# Patient Record
Sex: Female | Born: 1955 | Race: White | Hispanic: No | Marital: Married | Smoking: Never smoker
Health system: Southern US, Community
[De-identification: ages and names within clinical notes are randomized; demographics above are authoritative.]

## PROBLEM LIST (undated history)

## (undated) DIAGNOSIS — R011 Cardiac murmur, unspecified: Secondary | ICD-10-CM

## (undated) DIAGNOSIS — K219 Gastro-esophageal reflux disease without esophagitis: Secondary | ICD-10-CM

## (undated) DIAGNOSIS — R0600 Dyspnea, unspecified: Secondary | ICD-10-CM

## (undated) HISTORY — PX: FRACTURE SURGERY: SHX138

## (undated) HISTORY — PX: CHOLECYSTECTOMY: SHX55

## (undated) HISTORY — PX: OTHER SURGICAL HISTORY: SHX169

## (undated) HISTORY — PX: TUBAL LIGATION: SHX77

## (undated) HISTORY — PX: LAPAROSCOPIC GASTRIC SLEEVE RESECTION: SHX5895

---

## 2011-11-20 ENCOUNTER — Ambulatory Visit: Payer: Self-pay | Admitting: Internal Medicine

## 2011-11-20 ENCOUNTER — Inpatient Hospital Stay: Payer: Self-pay | Admitting: Surgery

## 2011-11-20 LAB — CBC
HCT: 45.9 %
HGB: 15.8 g/dL
MCH: 31.2 pg
MCHC: 34.4 g/dL
MCV: 91 fL
Platelet: 212 x10 3/mm 3
RBC: 5.06 X10 6/mm 3
RDW: 13 %
WBC: 10.2 x10 3/mm 3

## 2011-11-20 LAB — COMPREHENSIVE METABOLIC PANEL
Alkaline Phosphatase: 123 U/L (ref 50–136)
BUN: 21 mg/dL — ABNORMAL HIGH (ref 7–18)
Calcium, Total: 9.6 mg/dL (ref 8.5–10.1)
Co2: 27 mmol/L (ref 21–32)
Creatinine: 0.87 mg/dL (ref 0.60–1.30)
EGFR (Non-African Amer.): >60
Glucose: 112 mg/dL — ABNORMAL HIGH (ref 65–99)
SGOT(AST): 24 U/L (ref 15–37)
SGPT (ALT): 20 U/L

## 2011-11-20 LAB — TROPONIN I: Troponin-I: <0.02 ng/mL

## 2011-11-20 LAB — LIPASE, BLOOD: Lipase: 130 U/L

## 2011-11-21 LAB — BASIC METABOLIC PANEL
BUN: 25 mg/dL — ABNORMAL HIGH (ref 7–18)
BUN: 23 mg/dL — ABNORMAL HIGH (ref 7–18)
Calcium, Total: 9 mg/dL (ref 8.5–10.1)
Calcium, Total: 7.1 mg/dL — ABNORMAL LOW (ref 8.5–10.1)
Co2: 20 mmol/L — ABNORMAL LOW (ref 21–32)
Creatinine: 0.95 mg/dL (ref 0.60–1.30)
Creatinine: 0.88 mg/dL (ref 0.60–1.30)
EGFR (African American): >60
EGFR (African American): >60
EGFR (Non-African Amer.): >60
EGFR (Non-African Amer.): >60
Glucose: 230 mg/dL — ABNORMAL HIGH (ref 65–99)
Glucose: 211 mg/dL — ABNORMAL HIGH (ref 65–99)
Potassium: 4.7 mmol/L (ref 3.5–5.1)
Sodium: 141 mmol/L (ref 136–145)

## 2011-11-21 LAB — CBC WITH DIFFERENTIAL/PLATELET
Basophil #: 0 10*3/uL (ref 0.0–0.1)
Basophil %: 0.1 %
Eosinophil #: 0 10*3/uL (ref 0.0–0.7)
Eosinophil %: 0 %
HCT: 55.9 % — ABNORMAL HIGH (ref 35.0–47.0)
HCT: 42.9 % (ref 35.0–47.0)
HGB: 18.7 g/dL — ABNORMAL HIGH (ref 12.0–16.0)
Lymphocyte #: 0.5 10*3/uL — ABNORMAL LOW (ref 1.0–3.6)
Lymphocyte #: 0.4 10*3/uL — ABNORMAL LOW (ref 1.0–3.6)
MCH: 30.5 pg (ref 26.0–34.0)
MCHC: 32.6 g/dL (ref 32.0–36.0)
MCV: 92 fL (ref 80–100)
MCV: 93 fL (ref 80–100)
Monocyte #: 0.4 x10 3/mm (ref 0.2–0.9)
Monocyte #: 0.4 x10 3/mm (ref 0.2–0.9)
Monocyte %: 3.1 %
Monocyte %: 3.8 %
Neutrophil #: 10.7 10*3/uL — ABNORMAL HIGH (ref 1.4–6.5)
Neutrophil #: 10.7 10*3/uL — ABNORMAL HIGH (ref 1.4–6.5)
Neutrophil %: 92.5 %
RBC: 6.06 10*6/uL — ABNORMAL HIGH (ref 3.80–5.20)
RDW: 12.9 % (ref 11.5–14.5)
WBC: 11.6 10*3/uL — ABNORMAL HIGH (ref 3.6–11.0)
WBC: 11.5 10*3/uL — ABNORMAL HIGH (ref 3.6–11.0)

## 2011-11-21 LAB — URINALYSIS, COMPLETE
Bacteria: NONE SEEN
Bilirubin,UR: NEGATIVE
Blood: NEGATIVE
Glucose,UR: NEGATIVE mg/dL (ref 0–75)
Hyaline Cast: 23
Nitrite: NEGATIVE
Ph: 5 (ref 4.5–8.0)
Specific Gravity: 1.03 (ref 1.003–1.030)
Squamous Epithelial: 3

## 2011-11-22 LAB — CBC WITH DIFFERENTIAL/PLATELET
Basophil #: 0 10*3/uL (ref 0.0–0.1)
Basophil %: 0.1 %
Eosinophil #: 0 10*3/uL (ref 0.0–0.7)
HCT: 35.3 % (ref 35.0–47.0)
Lymphocyte #: 0.9 10*3/uL — ABNORMAL LOW (ref 1.0–3.6)
Lymphocyte %: 8.6 %
MCH: 31.6 pg (ref 26.0–34.0)
MCHC: 34.5 g/dL (ref 32.0–36.0)
MCV: 92 fL (ref 80–100)
Neutrophil %: 83.8 %
Platelet: 157 10*3/uL (ref 150–440)
RDW: 12.9 % (ref 11.5–14.5)

## 2011-11-22 LAB — BASIC METABOLIC PANEL
Anion Gap: 8 (ref 7–16)
Calcium, Total: 7.7 mg/dL — ABNORMAL LOW (ref 8.5–10.1)
Co2: 24 mmol/L (ref 21–32)
EGFR (African American): >60
Glucose: 157 mg/dL — ABNORMAL HIGH (ref 65–99)
Osmolality: 280 (ref 275–301)
Potassium: 5.1 mmol/L (ref 3.5–5.1)
Sodium: 138 mmol/L (ref 136–145)

## 2011-11-24 LAB — CBC WITH DIFFERENTIAL/PLATELET
Basophil #: 0 x10 3/mm 3
Basophil %: 0.3 %
Eosinophil #: 0 x10 3/mm 3
Eosinophil %: 0.6 %
HCT: 25.3 % — ABNORMAL LOW
HGB: 8.7 g/dL — ABNORMAL LOW
Lymphocyte %: 27.6 %
Lymphs Abs: 1.5 x10 3/mm 3
MCH: 31.6 pg
MCHC: 34.6 g/dL
MCV: 92 fL
Monocyte #: 0.4 "x10 3/mm "
Monocyte %: 7.4 %
Neutrophil #: 3.4 x10 3/mm 3
Neutrophil %: 64.1 %
Platelet: 113 x10 3/mm 3 — ABNORMAL LOW
RBC: 2.77 X10 6/mm 3 — ABNORMAL LOW
RDW: 13.1 %
WBC: 5.3 x10 3/mm 3

## 2011-11-24 LAB — BASIC METABOLIC PANEL
Anion Gap: 6 — ABNORMAL LOW (ref 7–16)
Chloride: 107 mmol/L (ref 98–107)
Co2: 32 mmol/L (ref 21–32)
Creatinine: 0.59 mg/dL — ABNORMAL LOW (ref 0.60–1.30)
EGFR (Non-African Amer.): >60
Sodium: 145 mmol/L (ref 136–145)

## 2011-11-25 LAB — CBC WITH DIFFERENTIAL/PLATELET
Basophil #: 0 10*3/uL (ref 0.0–0.1)
Basophil %: 0.6 %
Eosinophil %: 1.4 %
HCT: 30.2 % — ABNORMAL LOW (ref 35.0–47.0)
HGB: 10.6 g/dL — ABNORMAL LOW (ref 12.0–16.0)
Lymphocyte #: 1.4 10*3/uL (ref 1.0–3.6)
MCH: 31.7 pg (ref 26.0–34.0)
MCV: 91 fL (ref 80–100)
Monocyte #: 0.3 x10 3/mm (ref 0.2–0.9)
Monocyte %: 6.9 %
Neutrophil #: 2.8 10*3/uL (ref 1.4–6.5)
Platelet: 175 10*3/uL (ref 150–440)
RBC: 3.34 10*6/uL — ABNORMAL LOW (ref 3.80–5.20)
WBC: 4.7 10*3/uL (ref 3.6–11.0)

## 2011-11-25 LAB — BASIC METABOLIC PANEL
Anion Gap: 7 (ref 7–16)
BUN: 5 mg/dL — ABNORMAL LOW (ref 7–18)
Chloride: 106 mmol/L (ref 98–107)
Creatinine: 0.54 mg/dL — ABNORMAL LOW (ref 0.60–1.30)
EGFR (African American): >60
EGFR (Non-African Amer.): >60
Glucose: 100 mg/dL — ABNORMAL HIGH (ref 65–99)
Osmolality: 282 (ref 275–301)
Potassium: 3.2 mmol/L — ABNORMAL LOW (ref 3.5–5.1)
Sodium: 143 mmol/L (ref 136–145)

## 2012-01-26 ENCOUNTER — Ambulatory Visit: Payer: Self-pay | Admitting: Surgery

## 2012-01-26 LAB — HEPATIC FUNCTION PANEL A (ARMC)
Alkaline Phosphatase: 95 U/L (ref 50–136)
Bilirubin, Direct: 0.2 mg/dL (ref 0.00–0.20)
Bilirubin,Total: 0.8 mg/dL (ref 0.2–1.0)
Total Protein: 7.1 g/dL (ref 6.4–8.2)

## 2012-01-26 LAB — CBC WITH DIFFERENTIAL/PLATELET
Basophil %: 2.7 %
Eosinophil #: 0.1 10*3/uL (ref 0.0–0.7)
Eosinophil %: 1.9 %
HCT: 40.7 % (ref 35.0–47.0)
Lymphocyte #: 1.4 10*3/uL (ref 1.0–3.6)
Monocyte #: 0.2 x10 3/mm (ref 0.2–0.9)
Neutrophil #: 2 10*3/uL (ref 1.4–6.5)
Neutrophil %: 53.1 %
Platelet: 190 10*3/uL (ref 150–440)
RBC: 4.34 10*6/uL (ref 3.80–5.20)
RDW: 14 % (ref 11.5–14.5)
WBC: 3.8 10*3/uL (ref 3.6–11.0)

## 2012-01-26 LAB — BASIC METABOLIC PANEL
BUN: 15 mg/dL (ref 7–18)
Calcium, Total: 9.2 mg/dL (ref 8.5–10.1)
Chloride: 104 mmol/L (ref 98–107)
Co2: 31 mmol/L (ref 21–32)
EGFR (Non-African Amer.): >60
Glucose: 65 mg/dL (ref 65–99)
Osmolality: 284 (ref 275–301)
Potassium: 3.9 mmol/L (ref 3.5–5.1)
Sodium: 143 mmol/L (ref 136–145)

## 2012-01-31 ENCOUNTER — Ambulatory Visit: Payer: Self-pay | Admitting: Surgery

## 2013-09-12 IMAGING — CR DG CHEST 1V PORT
1 series · 1 of 1 positions shown · non-contrast
Comparison: none

REASON FOR EXAM: Line placement
COMMENTS:

[portable]
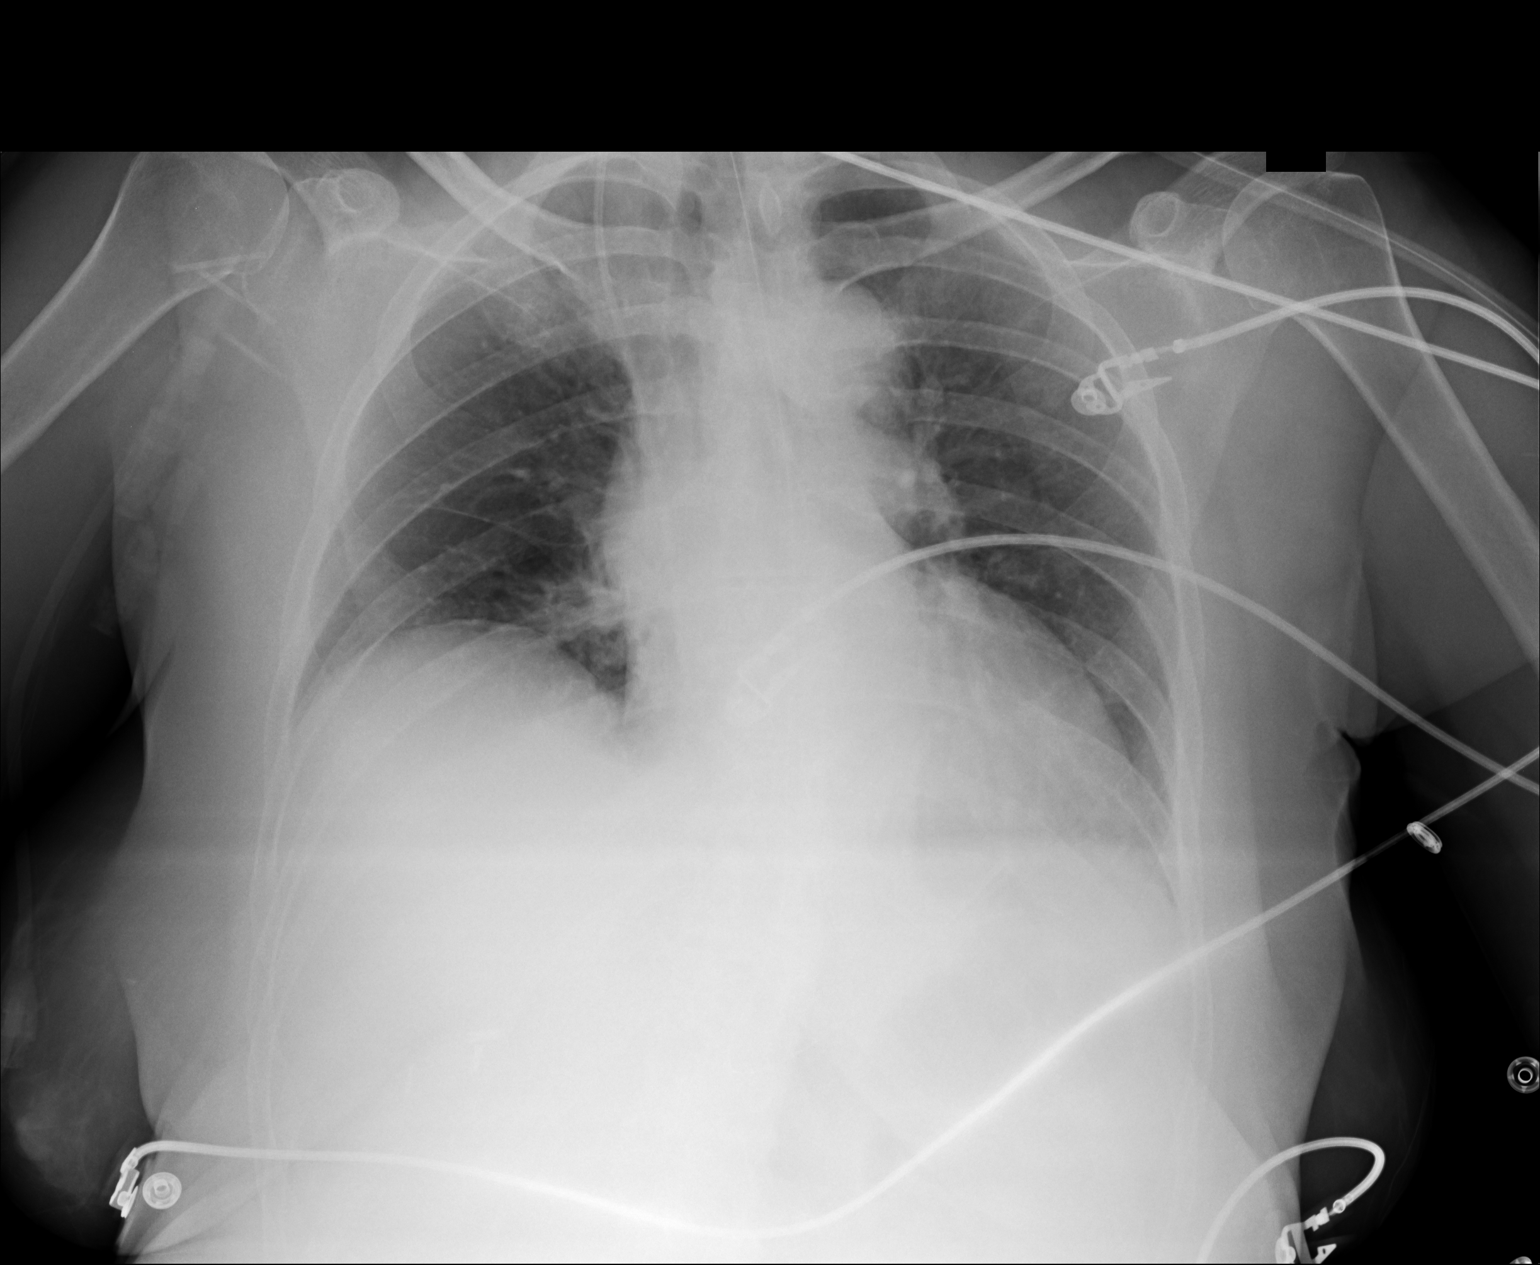

[1 of 1 positions shown; findings below may reference images not displayed]

PROCEDURE:     DXR - DXR PORTABLE CHEST SINGLE VIEW  - November 21, 2011  [DATE]

RESULT:     The patient has undergone interval placement of a right internal
jugular venous catheter. The tip lies in the region of the junction of the
SVC with the right atrium. The lungs are hypoinflated especially on the
right. The cardiac silhouette is top normal in size. There is no evidence of
a pleural effusion or alveolar infiltrate or pulmonary vascular congestion.
There is no pneumothorax.
IMPRESSION: There is no evidence of a post procedure complication
following placement of a right internal jugular venous catheter.

## 2013-09-12 IMAGING — CT CT ABD-PELV W/ CM
1 of 3 series · 14 of 32 positions shown, 18 images · IV contrast (isovue)
Comparison: none

REASON FOR EXAM: (1) Ischemic bowel; (2) same
COMMENTS:

PROCEDURE:     CT  - CT ABDOMEN / PELVIS  W  - November 21, 2011  [DATE]
RESULT:     Comparison:  11/20/2011
TECHNIQUE: Multiple axial images of the abdomen and pelvis were performed
from the lung bases to the pubic symphysis, with p.o. contrast and with 85
mL of Isovue 370 intravenous contrast.

[Series 2: soft tissue · axial · 0.67mm/px · z∈[-308,+134]mm · 14 of 163 slices shown, 18 images]
[im 8/163  soft-tissue]
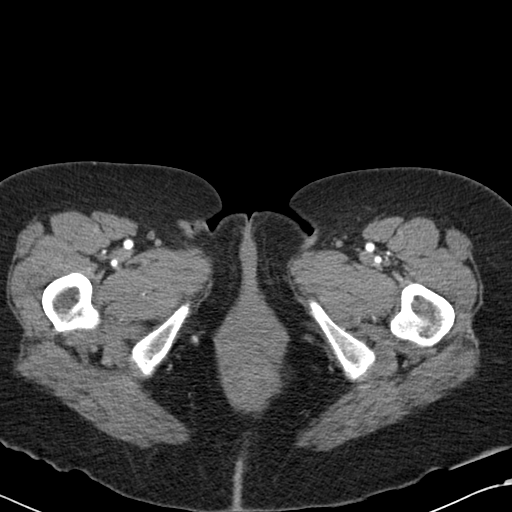
[im 8/163  bone]
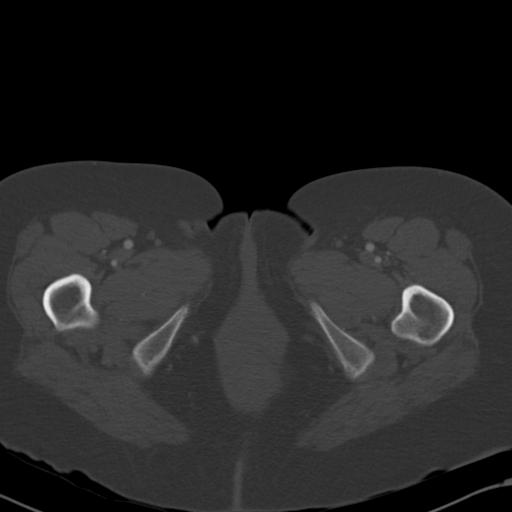
[im 23/163  soft-tissue]
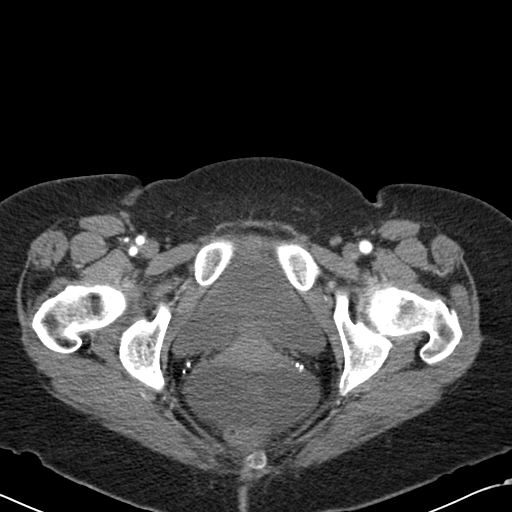
[im 37/163  soft-tissue]
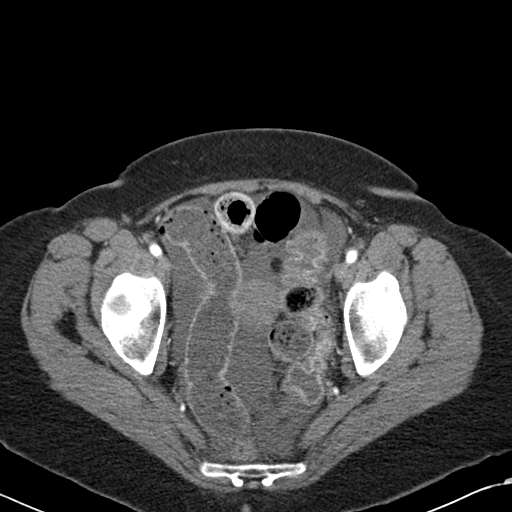
[im 52/163  soft-tissue]
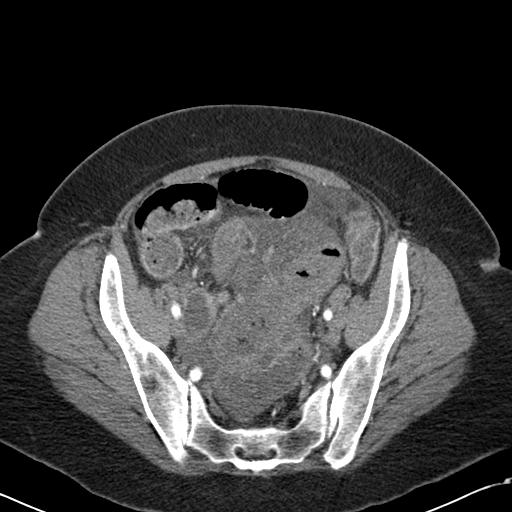
[im 59/163  soft-tissue]
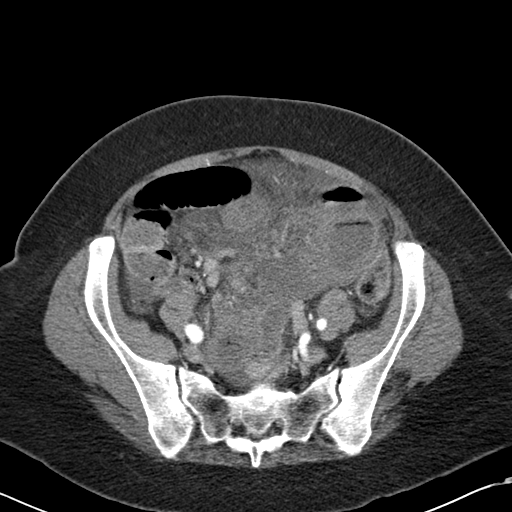
[im 74/163  soft-tissue]
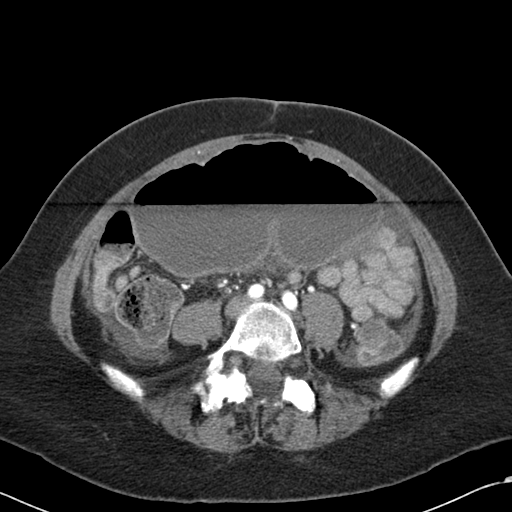
[im 89/163  soft-tissue]
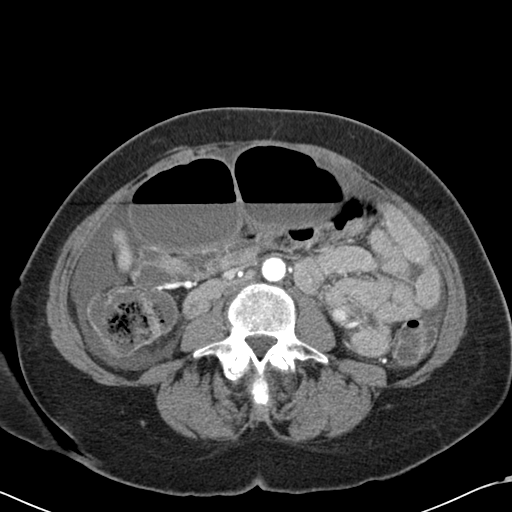
[im 104/163  soft-tissue]
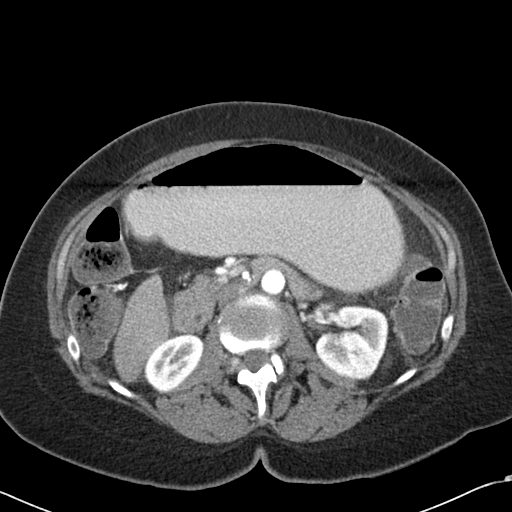
[im 111/163  soft-tissue]
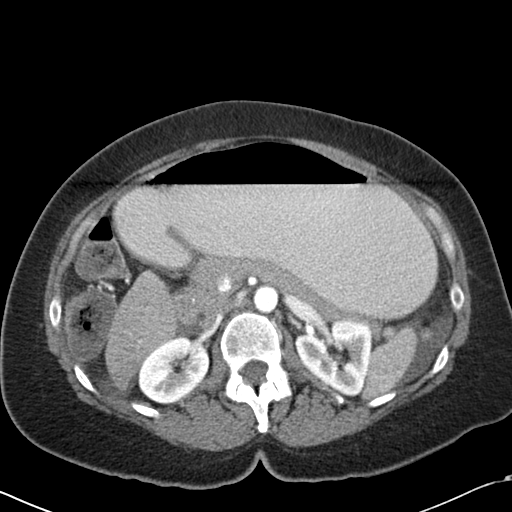
[im 111/163  bone]
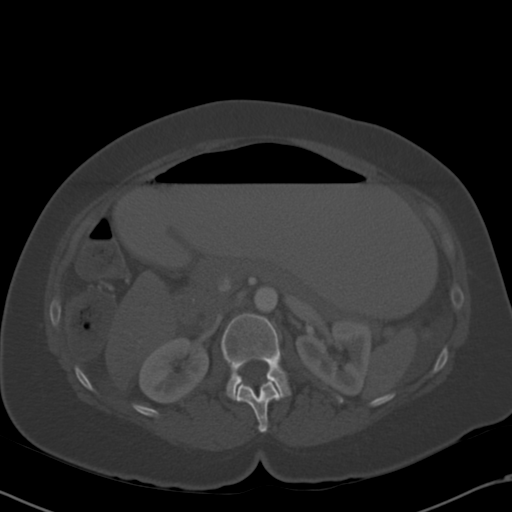
[im 126/163  soft-tissue]
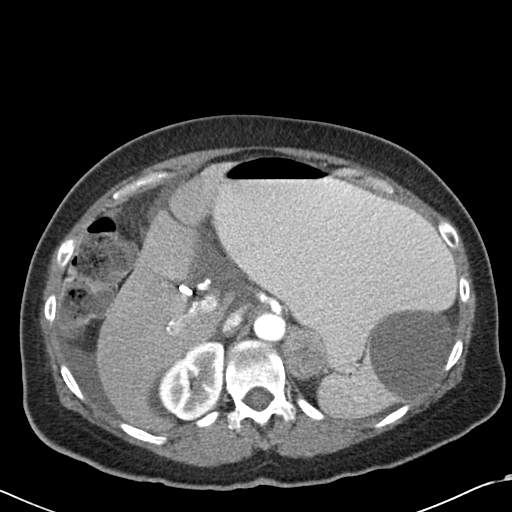
[im 133/163  lung]
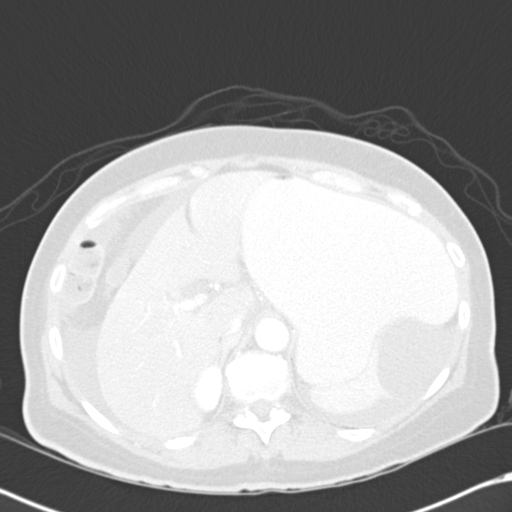
[im 140/163  soft-tissue]
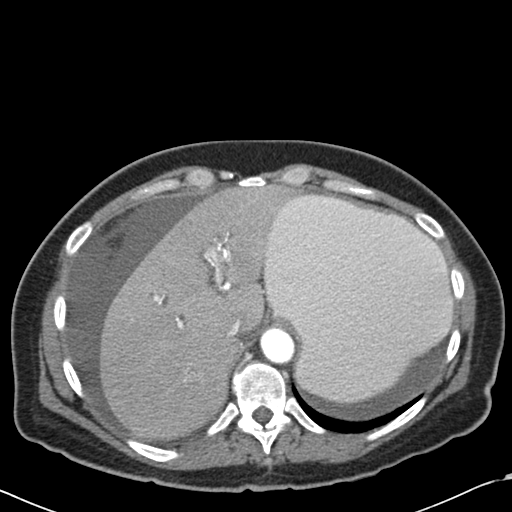
[im 140/163  lung]
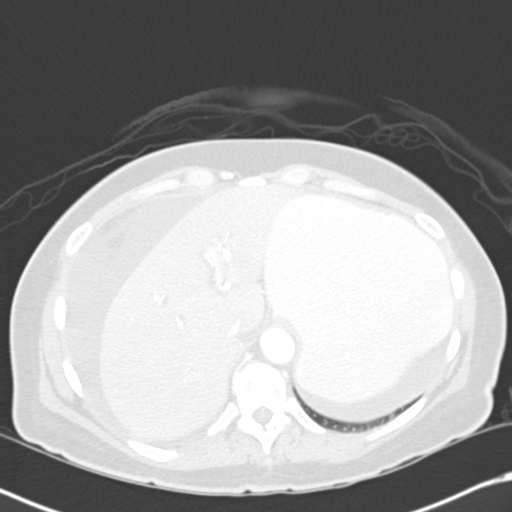
[im 148/163  lung]
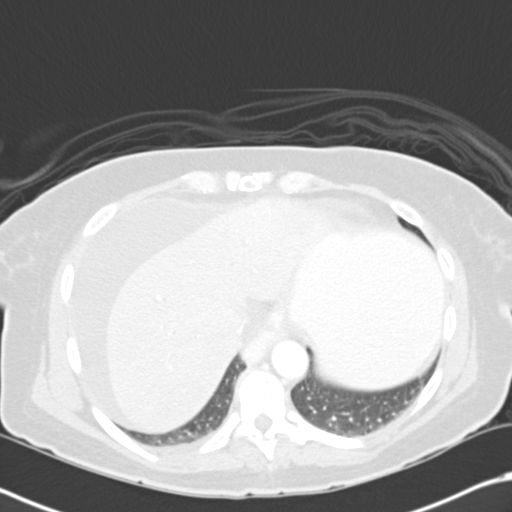
[im 155/163  soft-tissue]
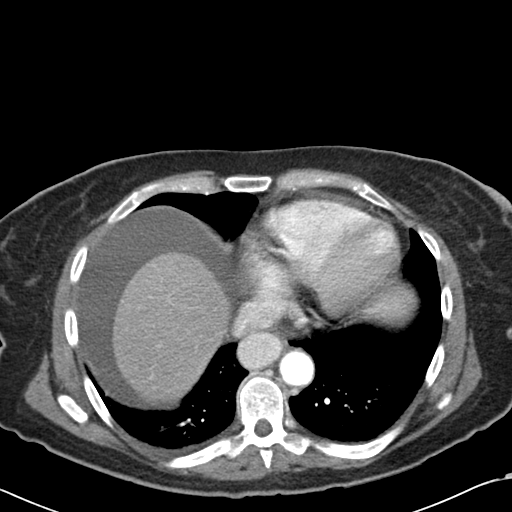
[im 155/163  lung]
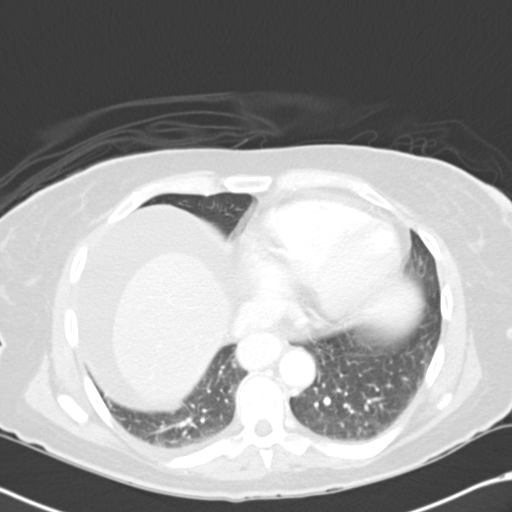

[14 of 32 positions shown; findings below may reference images not displayed]

FINDINGS: There is a small amount of perihepatic free fluid, which is new from the
recent prior. There is mild prominence of the intrahepatic biliary ducts
which may be related to the prior cholecystectomy. Small attenuation lesion
in the right hepatic lobe is too small to characterize. There is a 6 cm
low-attenuation lesion in the spleen which most likely represents a cyst.
There is a small adjacent calcification. The stomach is moderate to severely
distended, containing oral contrast material. The oral contrast material did
not significantly pass beyond stomach. Mild thickening in the region of the
gastric antrum is felt to be secondary to underdistention. There is a mass
in the left adrenal gland which is indeterminate on this study, but
consistent with an adrenal adenoma on the prior noncontrast study.

There is a very large fluid and air collection in the pelvis which appears
to extend from abnormally thickened bowel loops and is felt to represent a
severely distended loop of small bowel. What is felt to represent proximal
and distal small bowel demonstrates significant bowel wall thickening. There
is adjacent inflammatory change and free fluid, which is new from the recent
prior study. This appears to extend to the region of the terminal ileum.
There is a focal area of decompression at the terminal ileum. The small
bowel loops in the upper abdomen are decompressed. The celiac artery, SMA,
and IMA are patent.

No aggressive lytic or sclerotic osseous lesions are identified.
IMPRESSION: 1. There is what is felt to represent a severely distended loop small bowel
in the pelvis and lower abdomen. The bowel wall proximal and distal to this
region demonstrates severe bowel wall thickening and adjacent inflammatory
changes. This has significantly increased from prior. Possibilities would
include a focal loop of ischemic bowel. Sequela of a cecal volvulus is felt
less likely.
2. Low-attenuation lesion in the spleen likely represents a cyst. Correlate
for prior history of trauma.

This was discussed with Dr. Shatis Kumar Rahoo in person and 4333 hours 11/21/2011.

## 2014-09-02 NOTE — Discharge Summary (Signed)
PATIENT NAME:  Bianca Fields, Bianca Fields MR#:  [id number] DATE OF BIRTH:  [birth year]  DATE OF ADMISSION:  11/20/2011 DATE OF DISCHARGE:  11/27/2011  BRIEF HISTORY: [name] is a 59 year old woman admitted through the Emergency Room with a several hour history of significant abdominal discomfort. She had a similar episode several months ago which resolved spontaneously but her pain began early in the morning and she presented to the Emergency Room for further evaluation. CT scan without contrast demonstrated possible enteritis, possible ischemia. She had a slightly elevated lactate level at 1.5. She did not have significantly elevated white blood cell count. She was admitted to the hospital with presumptive diagnosis of inflammatory bowel disease or ischemic bowel problems. She was admitted late in the evening on the 7th and started empirically on antibiotic therapy. The following morning CT scan without contrast was performed as she did have a contrast allergy which was treated overnight with the appropriate prep. The following morning the scan was repeated which did demonstrate what appeared to be evidence of significant small bowel distention with large dilated loops of small bowel suggestive of obstruction versus ischemia. Volvulus could not be ruled out. Because of the progression of her abdominal distention and her significant abdominal discomfort, we felt that she should be taken urgently to surgery. Surgical intervention was performed with a laparotomy which demonstrated a cecal volvulus and ischemic and necrotic colon. A formal right colectomy was performed without difficulty. She had very slow return of bowel function but symptoms improved rapidly otherwise. She was able to tolerate a liquid diet by the 12th, advanced to a soft diet by the 13th, and discharged home on the 14th. Pathology did demonstrate necrotic colon with viable margins.   DISCHARGE MEDICATIONS: Percocet 5/325 1 to 2 every six hours p.r.n. pain.    FINAL DISCHARGE DIAGNOSIS: Cecal volvulus.  SURGERY: Right colectomy.   ____________________________ Quentin Orealph L. Ely III, MD rle:drc D: 12/02/2011 20:54:18 ET T: 12/05/2011 10:14:13 ET JOB#: [id number]  cc: Quentin Orealph L. Ely III, MD, <Dictator> Quentin OreALPH L ELY MD ELECTRONICALLY SIGNED 12/06/2011 22:43

## 2014-09-07 NOTE — Op Note (Signed)
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   POSTOPERATIVE DIAGNOSIS:  Cecal volvulus.  SURGEON: Quentin Orealph L. Ely, MD   ASSISTANT: Westley Gamblesim Oaks, MD   OPERATIVE PROCEDURE: Exploratory laparotomy with right colectomy, placement of central venous catheter.   SURGEON: Quentin Orealph L. Ely, M.D.   ANESTHESIA: General.  OPERATIVE PROCEDURE:  With the patient in the supine position after the induction of appropriate general anesthesia, the patient's right neck was interrogated with the ultrasound and a large internal jugular vein identified. The vein was cannulated on a single pass and a wire passed into the right heart without difficulty. The vein was dilated and the triple lumen catheter inserted over the wire into the great vessel system. It was secured in place and sterilely dressed. The patient's abdomen was then prepped with Betadine and draped with sterile towels. An alcohol wipe, Betadine- impregnated Steri-Drape were utilized. A midline incision was made from just above the umbilicus to the mid suprapubic area. This was carried down through the subcutaneous tissues with Bovie electrocautery. Midline fascia was identified and opened the length of the skin incision as was the peritoneum. A large amount of foul-smelling bloody fluid was immediately encountered.  There was a large obviously necrotic portion of bowel. Careful dissection identified that segment as the right colon and careful observation identified a mesenteric twist creating a sigmoid volvulus. The bowel was untwisted. The terminal ileum and transverse colon were divided with a single application of a GIA-75 stapling device. The mesentery was taken down with the LigaSure apparatus. The bowel was passed off the table. Two loops of bowel were placed side by side and a functional end-to-end anastomosis created with  the GIA-75 stapling device. One limb of the device was put in each loop of bowel and the stapler approximated and fired. There was no significant bleeding and the anastomosis appeared to be satisfactory. The defect was closed with single application of the TX-60 stapling device.  The mesenteric defect was closed with running suture of 3-0 Vicryl. Some omentum was placed over the anastomosis with 3-0 Vicryl.  There appeared to be a large adhesion in the terminal ileum which was divided, taking a small segment of bowel with the TX-60 stapling device. There was no significant narrowing of the bowel. The abdomen was then examined. The bowel was run from ligament of Treitz to colon and no significant abnormalities were identified. There were good pulsations in the mesentery. There was a large splenic cyst identified by palpation, previously seen on CT. No significant liver abnormalities noted. The stomach was decompressed. The colon appeared to be otherwise unremarkable. The area was copiously suctioned and irrigated with warm saline solution. Midline fascia was closed with a running suture of looped PDS suture tied in the middle. The skin was loosely stapled. Sterile dressings were applied. The patient was returned to the recovery room having tolerated the procedure well.  Sponge, instrument, and needle counts were correct times two in the operating room.     ____________________________ Quentin Orealph L. Ely III, MD rle:bjt D: 11/21/2011 13:21:49 ET T: 11/21/2011 14:36:05 ET JOB#: [id number]  cc: Quentin Orealph L. Ely III, MD, <Dictator> Quentin OreALPH L ELY MD ELECTRONICALLY SIGNED 11/22/2011 16:01

## 2017-01-03 ENCOUNTER — Other Ambulatory Visit: Payer: Self-pay | Admitting: Obstetrics & Gynecology

## 2017-01-03 DIAGNOSIS — Z1231 Encounter for screening mammogram for malignant neoplasm of breast: Secondary | ICD-10-CM

## 2017-02-01 ENCOUNTER — Other Ambulatory Visit: Payer: Self-pay

## 2017-02-05 LAB — COMPREHENSIVE METABOLIC PANEL
A/G RATIO: 1.5 (ref 1.2–2.2)
ALBUMIN: 4.2 g/dL (ref 3.6–4.8)
ALT: 16 IU/L (ref 0–32)
AST: 21 IU/L (ref 0–40)
Alkaline Phosphatase: 91 IU/L (ref 39–117)
BILIRUBIN TOTAL: 0.7 mg/dL (ref 0.0–1.2)
BUN / CREAT RATIO: 23 (ref 12–28)
BUN: 17 mg/dL (ref 8–27)
CALCIUM: 9.5 mg/dL (ref 8.7–10.3)
CHLORIDE: 104 mmol/L (ref 96–106)
CO2: 23 mmol/L (ref 20–29)
Creatinine, Ser: 0.74 mg/dL (ref 0.57–1.00)
GFR, EST AFRICAN AMERICAN: 102 mL/min/{1.73_m2} (ref >59)
GFR, EST NON AFRICAN AMERICAN: 88 mL/min/{1.73_m2} (ref >59)
GLOBULIN, TOTAL: 2.8 g/dL (ref 1.5–4.5)
Glucose: 93 mg/dL (ref 65–99)
POTASSIUM: 4.6 mmol/L (ref 3.5–5.2)
SODIUM: 140 mmol/L (ref 134–144)
TOTAL PROTEIN: 7 g/dL (ref 6.0–8.5)

## 2017-02-05 LAB — CBC WITH DIFFERENTIAL/PLATELET
BASOS: 0 %
Basophils Absolute: 0 10*3/uL (ref 0.0–0.2)
EOS (ABSOLUTE): 0.1 10*3/uL (ref 0.0–0.4)
EOS: 2 %
HEMATOCRIT: 42.5 % (ref 34.0–46.6)
Hemoglobin: 14.3 g/dL (ref 11.1–15.9)
IMMATURE GRANS (ABS): 0 10*3/uL (ref 0.0–0.1)
Immature Granulocytes: 0 %
LYMPHS: 28 %
Lymphocytes Absolute: 1.7 10*3/uL (ref 0.7–3.1)
MCH: 31 pg (ref 26.6–33.0)
MCHC: 33.6 g/dL (ref 31.5–35.7)
MCV: 92 fL (ref 79–97)
MONOS ABS: 0.3 10*3/uL (ref 0.1–0.9)
Monocytes: 5 %
NEUTROS ABS: 4.1 10*3/uL (ref 1.4–7.0)
Neutrophils: 65 %
PLATELETS: 248 10*3/uL (ref 150–379)
RBC: 4.62 x10E6/uL (ref 3.77–5.28)
RDW: 13.9 % (ref 12.3–15.4)
WBC: 6.3 10*3/uL (ref 3.4–10.8)

## 2017-02-05 LAB — LIPID PANEL W/O CHOL/HDL RATIO
Cholesterol, Total: 222 mg/dL — ABNORMAL HIGH (ref 100–199)
HDL: 67 mg/dL (ref >39)
LDL Calculated: 119 mg/dL — ABNORMAL HIGH (ref 0–99)
Triglycerides: 181 mg/dL — ABNORMAL HIGH (ref 0–149)
VLDL Cholesterol Cal: 36 mg/dL (ref 5–40)

## 2017-02-05 LAB — HGB A1C W/O EAG: HEMOGLOBIN A1C: 5.6 % (ref 4.8–5.6)

## 2017-02-05 LAB — TSH: TSH: 0.537 u[IU]/mL (ref 0.450–4.500)

## 2017-02-05 LAB — VITAMIN B12: Vitamin B-12: 513 pg/mL (ref 232–1245)

## 2017-02-05 LAB — IRON: Iron: 105 ug/dL (ref 27–159)

## 2017-02-05 LAB — VITAMIN D 25 HYDROXY (VIT D DEFICIENCY, FRACTURES): Vit D, 25-Hydroxy: 24.7 ng/mL — ABNORMAL LOW (ref 30.0–100.0)

## 2017-02-05 LAB — FOLATE: Folate: 15.1 ng/mL (ref >3.0)

## 2017-02-05 LAB — VITAMIN B1: THIAMINE: 145.3 nmol/L (ref 66.5–200.0)

## 2017-02-10 ENCOUNTER — Ambulatory Visit
Admission: RE | Admit: 2017-02-10 | Discharge: 2017-02-10 | Disposition: A | Discharge status: Home / Self Care | Payer: BC Managed Care – PPO | Source: Ambulatory Visit | Attending: Obstetrics & Gynecology | Admitting: Obstetrics & Gynecology

## 2017-02-10 ENCOUNTER — Encounter: Payer: Self-pay | Admitting: Radiology

## 2017-02-10 DIAGNOSIS — Z1231 Encounter for screening mammogram for malignant neoplasm of breast: Secondary | ICD-10-CM | POA: Insufficient documentation

## 2017-02-15 ENCOUNTER — Other Ambulatory Visit: Payer: Self-pay | Admitting: *Deleted

## 2017-02-15 ENCOUNTER — Inpatient Hospital Stay
Admission: RE | Admit: 2017-02-15 | Discharge: 2017-02-15 | Disposition: A | Discharge status: Home / Self Care | Payer: Self-pay | Source: Ambulatory Visit | Attending: *Deleted | Admitting: *Deleted

## 2017-02-15 ENCOUNTER — Encounter: Payer: BC Managed Care – PPO | Attending: General Surgery | Admitting: Dietician

## 2017-02-15 ENCOUNTER — Encounter: Payer: Self-pay | Admitting: Dietician

## 2017-02-15 VITALS — Ht 64.0 in | Wt 244.5 lb

## 2017-02-15 DIAGNOSIS — Z9289 Personal history of other medical treatment: Secondary | ICD-10-CM

## 2017-02-15 DIAGNOSIS — Z6841 Body Mass Index (BMI) 40.0 and over, adult: Secondary | ICD-10-CM

## 2017-02-15 DIAGNOSIS — Z713 Dietary counseling and surveillance: Secondary | ICD-10-CM | POA: Insufficient documentation

## 2017-02-15 NOTE — Progress Notes (Signed)
Nutritional Assessment: Gastrectomy   Surgery Type: undecided  Height : 64" Weight: 244.5lb BMI: 41.97    Upper Ideal Weight, %: 140# (174%) Patient's goal weight: 160#  Medications: Vit B12, Vit D, Prilosec, Tylenol Previous Surgeries: cholecystectomy, removal of ovarian cyst Drug allergies: Iodinated diagnostic agents Food allergies: nkfa Alcohol Use: n/a Tobacco use: n/a  Physical activity: not at this time  Weight History:  Childhood: 126# in the 6th grade, obese    Adolescence: 150# throughout high school, overweight-normal weight    Adulthood: fluctuations between 160-250#; on and off dieting     Weight 1 year ago: 220#  Dieting History: history of yo/yo dieting starting in high school. Lost 40# during first weight loss attempt but gained the weight back in college. After college she reduced her food intake and took OTC diet pills to reduce her wt to 123#. She became pregnant with her first child and gained 50#. Went on the [employer] and lost 40# but gained the weight back when she was pregnant with her second child. Reports being 170# in her early 30s with steady weight gain up to 250# over a number of years. Went on [employer] Watchers and lost 50# but then gained that weight back "and then some" (a usual pattern for her). Tried other diets such as Vickey Sages, Doylene Bode. Slim Fast, other OTC weight loss pills with the same results. Last attempt trying Weight Watchers was 66yrs ago (tried for wt loss 3-4x on this program) and lost 70# (wt down to 160#) but again, wt was regained. Last diet was Contrave over a year ago and lost 26#, regained. Recently reports extensive, mostly online, research about bariatric surgery and has purchased a FitBit in order to track health features like activity more closely. Has started to make healthier food choices the past 3 days (cutting back to one diet soda/day,  Dietary Recall/Food and Fluid: She performs the food shopping and prepares the meals at  home. Rarely eats fruit   Dining out: 4-6x/wk Typical eating pattern: 3 meals and  1-2 snacks daily  Breakfast: yogurt or eggs + cinnamon raisin toast, Special K cereal Snack: n/a Lunch: tuna or [place] sandwich + pretzels Snack: salty; pretzels, goldfish Supper: chicken pie, baked chicken, BBQ chicken, peas, green beans, mashed potatoes, roasted potatoes, salads as a side with cheese and croutons and light viniagrette, stuffing, spaghetti, tacos Snack: similar to above Beverages: 3 diet sodas a day, low fat milk at night, orange juice, diet cranberry grape juice, unsweet tea, coffee 2-3/wk Out to eat (4-5x/wk): steak, chicken parm, chicken wrap, salads with all the fixings, baked sweet potato   Psychosocial: She denies binge eating, or other disordered eating behaviors. She does not typically engage in emotional eating.  Intervention: . Patient has researched various forms of bariatric surgery online and conversing with friends and family . Given Unjury nutritional shake to sample as well as pamphlet on their bariatric vitamins as well as written resources on pre-op goals, prep-op diet for liver reduction, diet stages . Pre-op dietary changes, particularly beverage choices and eliminating caffeine and carbonated beverages and overall pre-op liver reduction diet.  . Overview of post-op diet changes, including importance of hydration and adequate protein. Marland Kitchen She was encouraged to start experimenting with different protein drinks and MVI / Calcium supplements . She voices understanding of the instruction listed above and will continue to attempt weight reduction  Summary:  Patient has begun to make diet and lifestyle changes in efforts to lose weight  and prepare for bariatric surgery  She has a solid support system from family/ friends  She agrees to work on decreasing intake of starchy foods, eating slowly/ chewing thoroughly, and eliminating caffeine and carbonated beverages prior to  surgery  She is motivated to follow the bariatric diet guidelines pre and post op. From a nutrition standpoint she is ready to proceed with the bariatric surgery program                                                                                                                 Plan: The patient is to commit to returning for a pre-op class or RD follow-up visit prior to and post surgery  If you have any questions or need any further information, please feel free to contact me by phone, fax, or email.  Sincerely,   Aviva Kluver RD, LDN Nutrition and Diabetes Education [facility] Phone: [phone] Fax: [phone] [email or web address]

## 2017-06-02 ENCOUNTER — Encounter: Payer: Self-pay | Admitting: Dietician

## 2017-06-02 ENCOUNTER — Encounter: Payer: BC Managed Care – PPO | Attending: General Surgery | Admitting: Dietician

## 2017-06-02 VITALS — Wt 242.4 lb

## 2017-06-02 DIAGNOSIS — Z6841 Body Mass Index (BMI) 40.0 and over, adult: Secondary | ICD-10-CM | POA: Diagnosis not present

## 2017-06-02 DIAGNOSIS — Z713 Dietary counseling and surveillance: Secondary | ICD-10-CM | POA: Insufficient documentation

## 2017-06-02 NOTE — Progress Notes (Signed)
Appt start time: 1030 end time:  1100.  Assessment:  2nd SWL Appointment.   Start Wt at [facility]: 244.8# Wt: 242.4# BMI: 41.61  Surgery Type: Sleeve Gastrectomy  Learning Readiness:  Change in progress  MEDICATIONS: Vitamin B12, Vitamin D, Prilosec, Tylenol  DIETARY INTAKE & PROGRESS:  Patient has made several dietary changes since initial SWL session including: cutting out sodas, decreasing consumption of processed carbohydrates, eating on a more regular schedule, drinking decaf coffee and tea, aiming for sugars in the single digits on nutrition facts labels, drinking only small amounts of diet juices, tracking total calories/ fluids (goal of 64oz/day minimum) and protein (goal of at least 60g/ day). Reports to be drinking more water now that she does not drink soda. She has been trialing different protein drinks including Premier Protein, Unjury, Slim Fast and Aktins as well as lactose free milks like Fair Life. Other areas worked on include trying not to eat at meal times, chewing food more thoroughly, and sipping liquids rather than chugging. Has found it most difficult not to consume fluids around meal times. She has downloaded a Bariatric phone app which she is using to track calories, protein and fluid. She has also joined a Facebook bariatric support group.   Usual physical activity: light walking, not yet consistently               Nutritional Diagnosis:  [place]-3.3 Overweight/obesity related to past poor dietary habits and physical inactivity as evidenced by patient w/ scheduled bariatric surgery, sleeve gastrectomy, following dietary guidelines for continued weight loss.              Intervention:  Nutrition counseling for upcoming Bariatric Surgery.  Teaching Method Utilized: Visual Auditory   Barriers to learning/adherence to lifestyle change: none  Demonstrated degree of understanding via:  Teach Back   Monitoring/Evaluation:  Dietary intake, exercise, pre-op diet change  goals, and body weight in 3 week(s). Scheduled Pre-op class for February 15th.

## 2017-06-23 ENCOUNTER — Encounter: Payer: BC Managed Care – PPO | Attending: General Surgery | Admitting: Dietician

## 2017-06-23 ENCOUNTER — Encounter: Payer: Self-pay | Admitting: Dietician

## 2017-06-23 VITALS — Wt 238.6 lb

## 2017-06-23 DIAGNOSIS — Z6841 Body Mass Index (BMI) 40.0 and over, adult: Secondary | ICD-10-CM | POA: Diagnosis not present

## 2017-06-23 DIAGNOSIS — Z713 Dietary counseling and surveillance: Secondary | ICD-10-CM | POA: Insufficient documentation

## 2017-06-23 NOTE — Progress Notes (Signed)
Appt start time: 0900 end time:  0945.  Assessment: 3rd SWL Appointment.   Start Wt at [facility]: 244.8# Wt: 238.6# BMI: 40.96  Surgery Type: Sleeve Gastrectomy   Learning Readiness:  Change in progress  MEDICATIONS: Vitamin B12, Calcium, Vitamin D, Tylenol, Prilosec  DIETARY INTAKE & PROGRESS:  Patient continues to make lifestyle changes in order to prepare for surgery and life after. She also continues to see weight reduction. Based off of her information logged in the [employer] app she has been meeting her daily protein and fluid needs consistently. Fluids are mainly water with some low calorie, non-carbonated, sugar free flavor packets used at times. She has tried the [employer] Carnation Instant Breakfast packets in water or lactose-free milk as a protein source and liked them. Continues with elimination of soda which she says will likely be life-long d/t their "additive" property and with reduced intake of processed / high sugar items. She enjoys [employer] mochas, and we discussed lower sugar/ calorie versions of this drink for her to choose instead. She has become a label reader and now chooses items with sugar close to or in the single digits per serving. The items she had the most trouble finding with these requirements are yogurt and cereal which she consumes frequently. She is eating 3 meals / day and has snacks on some but not all days. Continues to struggle with not drinking around meal times and chewing to applesauce consistency. She and her husband have come up with a plan for eating meals out, as this is an activity that they enjoy doing together. Patient continues to be a good candidate for surgery.  Usual physical activity: Walks at the mall or outside (2miles) 5 days/wk. Uses her FitBit to track the distance and steps daily.              Nutritional Diagnosis:  [place]-3.3 Overweight/obesity related to past poor dietary habits and physical inactivity as evidenced by patient w/ scheduled  bariatric surgery, sleeve gastrectomy, following dietary guidelines for continued weight loss.              Intervention:  Nutrition counseling for upcoming Bariatric Surgery.  Teaching Method Utilized: Visual Auditory  Handouts given during visit include:  Vegetarian Protein Sources  Meal Planning Guideline  Barriers to learning/adherence to lifestyle change: none  Demonstrated degree of understanding via:  Teach Back   Monitoring/Evaluation:  Dietary intake, exercise, pre-op diet, and body weight in 7 day(s) for pre-op class February 15th.

## 2017-06-30 ENCOUNTER — Encounter (INDEPENDENT_AMBULATORY_CARE_PROVIDER_SITE_OTHER): Payer: BC Managed Care – PPO

## 2017-06-30 VITALS — Wt 242.0 lb

## 2017-06-30 DIAGNOSIS — Z6841 Body Mass Index (BMI) 40.0 and over, adult: Secondary | ICD-10-CM

## 2017-06-30 NOTE — Progress Notes (Signed)
Pre-Operative Nutrition Class:  Appt start time: 0900   End time:  1000.  Patient was seen on 06/30/17 for Pre-Operative Bariatric Surgery [facility]   Surgery date: unknown Surgery type: Sleeve Gastrectomy Start weight at [facility]: 244.8# Weight today: 242.0#   Samples given per MNT protocol. Patient educated on appropriate usage: Celebrate Vitamins Multivitamin Lot # not listed on packaging Exp: not listed on packaging  Celebrate Vitamins Calcium Citrate Lot # not listed on packaging Exp: not listed on packaging  [place] Lot # [id number] Exp: 10/09/17  Premier Protein Clear Lot # [id number] Exp: 05/03/18  The following the learning objectives were met by the patient during this course:  Identify Pre-Op Dietary Goals and will begin 2 weeks pre-operatively  Identify appropriate sources of fluids and proteins   State protein recommendations and appropriate sources pre and post-operatively  Identify Post-Operative Dietary Goals and will follow for 2 weeks post-operatively  Identify appropriate multivitamin and calcium sources  Describe the need for physical activity post-operatively and will follow MD recommendations  State when to call healthcare provider regarding medication questions or post-operative complications  Handouts given during class include:  Pre-Op Bariatric Surgery Diet Handout  Protein Shake Handout  Post-Op Bariatric Surgery Nutrition Handout  BELT Program Information Flyer  Support Group Information Flyer  [facility] Bariatric Supplements Price List  Follow-Up Plan: Patient will follow-up at [facility] 2 weeks post operatively for diet advancement per MD.

## 2017-07-13 ENCOUNTER — Other Ambulatory Visit: Payer: Self-pay | Admitting: General Surgery

## 2017-07-18 ENCOUNTER — Ambulatory Visit
Admission: RE | Admit: 2017-07-18 | Discharge: 2017-07-18 | Disposition: A | Discharge status: Home / Self Care | Payer: BC Managed Care – PPO | Source: Ambulatory Visit | Attending: General Surgery | Admitting: General Surgery

## 2017-08-25 ENCOUNTER — Ambulatory Visit: Payer: Self-pay | Admitting: General Surgery

## 2017-09-06 NOTE — Patient Instructions (Addendum)
Ebany Bowermaster  09/06/2017   Your procedure is scheduled on: 09-11-17   Report to [facility] Main  Entrance             Report to admitting at   0530 AM    Call this number if you have problems the morning of surgery [phone]    Remember:  MORNING OF SURGERY DRINK:  1SHAKE BEFORE YOU LEAVE HOME, DRINK ALL OF THE SHAKE AT ONE TIME.   NO SOLID FOOD AFTER 600 PM THE NIGHT BEFORE YOUR SURGERY. YOU MAY DRINK CLEAR FLUIDS. THE SHAKE YOU DRINK BEFORE YOU LEAVE HOME WILL BE THE LAST FLUIDS YOU DRINK BEFORE SURGERY.  PAIN IS EXPECTED AFTER SURGERY AND WILL NOT BE COMPLETELY ELIMINATED. AMBULATION AND TYLENOL WILL HELP REDUCE INCISIONAL AND GAS PAIN. MOVEMENT IS KEY!  YOU ARE EXPECTED TO BE OUT OF BED WITHIN 4 HOURS OF ADMISSION TO YOUR PATIENT ROOM.  SITTING IN THE RECLINER THROUGHOUT THE DAY IS IMPORTANT FOR DRINKING FLUIDS AND MOVING GAS THROUGHOUT THE GI TRACT.  COMPRESSION STOCKINGS SHOULD BE WORN [facility] STAY UNLESS YOU ARE WALKING.   INCENTIVE SPIROMETER SHOULD BE USED EVERY HOUR WHILE AWAKE TO DECREASE POST-OPERATIVE COMPLICATIONS SUCH AS PNEUMONIA.  WHEN DISCHARGED HOME, IT IS IMPORTANT TO CONTINUE TO WALK EVERY HOUR AND USE THE INCENTIVE SPIROMETER EVERY HOUR.     Take these medicines the morning of surgery with A SIP OF WATER: pantaprazole (protonix)                                You may not have any metal on your body including hair pins and              piercings  Do not wear jewelry, make-up, lotions, powders or perfumes, deodorant             Do not wear nail polish.  Do not shave  48 hours prior to surgery.            Do not bring valuables to the hospital. [facility] IS NOT             RESPONSIBLE   FOR VALUABLES.  Contacts, dentures or bridgework may not be worn into surgery.  Leave suitcase in the car. After surgery it may be brought to your room.               Please read over the following fact sheets you were  given: _____________________________________________________________________          [facility] - Preparing for Surgery Before surgery, you can play an important role.  Because skin is not sterile, your skin needs to be as free of germs as possible.  You can reduce the number of germs on your skin by washing with CHG (chlorahexidine gluconate) soap before surgery.  CHG is an antiseptic cleaner which kills germs and bonds with the skin to continue killing germs even after washing. Please DO NOT use if you have an allergy to CHG or antibacterial soaps.  If your skin becomes reddened/irritated stop using the CHG and inform your nurse when you arrive at Short Stay. Do not shave (including legs and underarms) for at least 48 hours prior to the first CHG shower.  You may shave your face/neck. Please follow these instructions carefully:  1.  Shower with CHG Soap  the night before surgery and the  morning of Surgery.  2.  If you choose to wash your hair, wash your hair first as usual with your  normal  shampoo.  3.  After you shampoo, rinse your hair and body thoroughly to remove the  shampoo.                           4.  Use CHG as you would any other liquid soap.  You can apply chg directly  to the skin and wash                       Gently with a scrungie or clean washcloth.  5.  Apply the CHG Soap to your body ONLY FROM THE NECK DOWN.   Do not use on face/ open                           Wound or open sores. Avoid contact with eyes, ears mouth and genitals (private parts).                       Wash face,  Genitals (private parts) with your normal soap.             6.  Wash thoroughly, paying special attention to the area where your surgery  will be performed.  7.  Thoroughly rinse your body with warm water from the neck down.  8.  DO NOT shower/wash with your normal soap after using and rinsing off  the CHG Soap.                9.  Pat yourself dry with a clean towel.            10.  Wear clean  pajamas.            11.  Place clean sheets on your bed the night of your first shower and do not  sleep with pets. Day of Surgery : Do not apply any lotions/deodorants the morning of surgery.  Please wear clean clothes to the hospital/surgery center.  FAILURE TO FOLLOW THESE INSTRUCTIONS MAY RESULT IN THE CANCELLATION OF YOUR SURGERY PATIENT SIGNATURE_________________________________  NURSE SIGNATURE__________________________________  ________________________________________________________________________  WHAT IS A BLOOD TRANSFUSION? Blood Transfusion Information  A transfusion is the replacement of blood or some of its parts. Blood is made up of multiple cells which provide different functions.  Red blood cells carry oxygen and are used for blood loss replacement.  White blood cells fight against infection.  Platelets control bleeding.  Plasma helps clot blood.  Other blood products are available for specialized needs, such as hemophilia or other clotting disorders. BEFORE THE TRANSFUSION  Who gives blood for transfusions?   Healthy volunteers who are fully evaluated to make sure their blood is safe. This is blood bank blood. Transfusion therapy is the safest it has ever been in the practice of medicine. Before blood is taken from a donor, a complete history is taken to make sure that person has no history of diseases nor engages in risky social behavior (examples are intravenous drug use or sexual activity with multiple partners). The donor's travel history is screened to minimize risk of transmitting infections, such as malaria. The donated blood is tested for signs of infectious diseases, such as HIV and hepatitis. The blood is then tested to be sure it is  compatible with you in order to minimize the chance of a transfusion reaction. If you or a relative donates blood, this is often done in anticipation of surgery and is not appropriate for emergency situations. It takes many days  to process the donated blood. RISKS AND COMPLICATIONS Although transfusion therapy is very safe and saves many lives, the main dangers of transfusion include:   Getting an infectious disease.  Developing a transfusion reaction. This is an allergic reaction to something in the blood you were given. Every precaution is taken to prevent this. The decision to have a blood transfusion has been considered carefully by your caregiver before blood is given. Blood is not given unless the benefits outweigh the risks. AFTER THE TRANSFUSION  Right after receiving a blood transfusion, you will usually feel much better and more energetic. This is especially true if your red blood cells have gotten low (anemic). The transfusion raises the level of the red blood cells which carry oxygen, and this usually causes an energy increase.  The nurse administering the transfusion will monitor you carefully for complications. HOME CARE INSTRUCTIONS  No special instructions are needed after a transfusion. You may find your energy is better. Speak with your caregiver about any limitations on activity for underlying diseases you may have. SEEK MEDICAL CARE IF:   Your condition is not improving after your transfusion.  You develop redness or irritation at the intravenous (IV) site. SEEK IMMEDIATE MEDICAL CARE IF:  Any of the following symptoms occur over the next 12 hours:  Shaking chills.  You have a temperature by mouth above 102 F (38.9 C), not controlled by medicine.  Chest, back, or muscle pain.  People around you feel you are not acting correctly or are confused.  Shortness of breath or difficulty breathing.  Dizziness and fainting.  You get a rash or develop hives.  You have a decrease in urine output.  Your urine turns a dark color or changes to pink, red, or brown. Any of the following symptoms occur over the next 10 days:  You have a temperature by mouth above 102 F (38.9 C), not controlled  by medicine.  Shortness of breath.  Weakness after normal activity.  The white part of the eye turns yellow (jaundice).  You have a decrease in the amount of urine or are urinating less often.  Your urine turns a dark color or changes to pink, red, or brown. Document Released: 04/29/2000 Document Revised: 07/25/2011 Document Reviewed: 12/17/2007 ExitCare Patient Information 2014 [place], [place].  _______________________________________________________________________  Incentive Spirometer  An incentive spirometer is a tool that can help keep your lungs clear and active. This tool measures how well you are filling your lungs with each breath. Taking long deep breaths may help reverse or decrease the chance of developing breathing (pulmonary) problems (especially infection) following:  A long period of time when you are unable to move or be active. BEFORE THE PROCEDURE   If the spirometer includes an indicator to show your best effort, your nurse or respiratory therapist will set it to a desired goal.  If possible, sit up straight or lean slightly forward. Try not to slouch.  Hold the incentive spirometer in an upright position. INSTRUCTIONS FOR USE  1. Sit on the edge of your bed if possible, or sit up as far as you can in bed or on a chair. 2. Hold the incentive spirometer in an upright position. 3. Breathe out normally. 4. Place the mouthpiece in your mouth  and seal your lips tightly around it. 5. Breathe in slowly and as deeply as possible, raising the piston or the ball toward the top of the column. 6. Hold your breath for 3-5 seconds or for as long as possible. Allow the piston or ball to fall to the bottom of the column. 7. Remove the mouthpiece from your mouth and breathe out normally. 8. Rest for a few seconds and repeat Steps 1 through 7 at least 10 times every 1-2 hours when you are awake. Take your time and take a few normal breaths between deep breaths. 9. The  spirometer may include an indicator to show your best effort. Use the indicator as a goal to work toward during each repetition. 10. After each set of 10 deep breaths, practice coughing to be sure your lungs are clear. If you have an incision (the cut made at the time of surgery), support your incision when coughing by placing a pillow or rolled up towels firmly against it. Once you are able to get out of bed, walk around indoors and cough well. You may stop using the incentive spirometer when instructed by your caregiver.  RISKS AND COMPLICATIONS  Take your time so you do not get dizzy or light-headed.  If you are in pain, you may need to take or ask for pain medication before doing incentive spirometry. It is harder to take a deep breath if you are having pain. AFTER USE  Rest and breathe slowly and easily.  It can be helpful to keep track of a log of your progress. Your caregiver can provide you with a simple table to help with this. If you are using the spirometer at home, follow these instructions: SEEK MEDICAL CARE IF:   You are having difficultly using the spirometer.  You have trouble using the spirometer as often as instructed.  Your pain medication is not giving enough relief while using the spirometer.  You develop fever of 100.5 F (38.1 C) or higher. SEEK IMMEDIATE MEDICAL CARE IF:   You cough up bloody sputum that had not been present before.  You develop fever of 102 F (38.9 C) or greater.  You develop worsening pain at or near the incision site. MAKE SURE YOU:   Understand these instructions.  Will watch your condition.  Will get help right away if you are not doing well or get worse. Document Released: 09/12/2006 Document Revised: 07/25/2011 Document Reviewed: 11/13/2006 [facility] Patient Information 2014 [place], [place].   ________________________________________________________________________

## 2017-09-08 ENCOUNTER — Other Ambulatory Visit: Payer: Self-pay

## 2017-09-08 ENCOUNTER — Encounter (HOSPITAL_COMMUNITY)
Admission: RE | Admit: 2017-09-08 | Discharge: 2017-09-08 | Disposition: A | Discharge status: Home / Self Care | Payer: BC Managed Care – PPO | Source: Ambulatory Visit | Attending: General Surgery | Admitting: General Surgery

## 2017-09-08 ENCOUNTER — Inpatient Hospital Stay (HOSPITAL_COMMUNITY): Admission: RE | Admit: 2017-09-08 | Payer: BC Managed Care – PPO | Source: Ambulatory Visit

## 2017-09-08 ENCOUNTER — Encounter (HOSPITAL_COMMUNITY): Payer: Self-pay

## 2017-09-08 DIAGNOSIS — Z6839 Body mass index (BMI) 39.0-39.9, adult: Secondary | ICD-10-CM | POA: Insufficient documentation

## 2017-09-08 DIAGNOSIS — Z01812 Encounter for preprocedural laboratory examination: Secondary | ICD-10-CM | POA: Insufficient documentation

## 2017-09-08 HISTORY — DX: Gastro-esophageal reflux disease without esophagitis: K21.9

## 2017-09-08 HISTORY — DX: Dyspnea, unspecified: R06.00

## 2017-09-08 HISTORY — DX: Cardiac murmur, unspecified: R01.1

## 2017-09-08 LAB — CBC WITH DIFFERENTIAL/PLATELET
BASOS PCT: 0 %
Basophils Absolute: 0 10*3/uL (ref 0.0–0.1)
EOS ABS: 0 10*3/uL (ref 0.0–0.7)
Eosinophils Relative: 1 %
HEMATOCRIT: 43.2 % (ref 36.0–46.0)
Hemoglobin: 14.4 g/dL (ref 12.0–15.0)
Lymphocytes Relative: 25 %
Lymphs Abs: 1.2 10*3/uL (ref 0.7–4.0)
MCH: 29.6 pg (ref 26.0–34.0)
MCHC: 33.3 g/dL (ref 30.0–36.0)
MCV: 88.9 fL (ref 78.0–100.0)
MONO ABS: 0.3 10*3/uL (ref 0.1–1.0)
MONOS PCT: 6 %
Neutro Abs: 3.1 10*3/uL (ref 1.7–7.7)
Neutrophils Relative %: 68 %
Platelets: 201 10*3/uL (ref 150–400)
RBC: 4.86 MIL/uL (ref 3.87–5.11)
RDW: 13.6 % (ref 11.5–15.5)
WBC: 4.6 10*3/uL (ref 4.0–10.5)

## 2017-09-08 LAB — COMPREHENSIVE METABOLIC PANEL
ALBUMIN: 3.9 g/dL (ref 3.5–5.0)
ALT: 20 U/L (ref 14–54)
ANION GAP: 13 (ref 5–15)
AST: 26 U/L (ref 15–41)
Alkaline Phosphatase: 72 U/L (ref 38–126)
BILIRUBIN TOTAL: 1 mg/dL (ref 0.3–1.2)
BUN: 17 mg/dL (ref 6–20)
CO2: 24 mmol/L (ref 22–32)
Calcium: 9.3 mg/dL (ref 8.9–10.3)
Chloride: 104 mmol/L (ref 101–111)
Creatinine, Ser: 0.62 mg/dL (ref 0.44–1.00)
GFR calc non Af Amer: >60 mL/min (ref >60)
GLUCOSE: 96 mg/dL (ref 65–99)
POTASSIUM: 4.1 mmol/L (ref 3.5–5.1)
Sodium: 141 mmol/L (ref 135–145)
TOTAL PROTEIN: 7.1 g/dL (ref 6.5–8.1)

## 2017-09-08 LAB — ABO/RH: ABO/RH(D): O POS

## 2017-09-08 NOTE — Progress Notes (Signed)
ekg 07-18-17 epic cxr 07-18-17 epic

## 2017-09-10 NOTE — H&P (Signed)
History of Present Illness Bianca Salina T. Luisa Louk MD; 08/25/2017 10:45 AM) The patient is a 62 year old female who presents with obesity. Patient returns for her preoperative visit prior to planned laparoscopic sleeve gastrectomy. Patient is referred by Dr Ranae Plumber for consideration for surgical treatment for morbid obesity. The patient gives a history of progressive obesity since early adulthood despite innumerable attempts at medical management. She has been able to lose up to 70 pounds at a time but then experiences progressive weight regain. Obesity has been affecting the patient in a number of ways including fatigue and shortness of breath with exertion and joint pain which significantly limits her activities. Significant co-morbid illnesses have begun to develop including prediabetes and GERD. The patient has been to our initial information seminar, researched surgical options thoroughly, and is interested in sleeve gastrectomy. she has significant surgical history of laparoscopic cholecystectomy in 2005 and then emergency resection for volvulus in 2013. She does have some moderate reflux which is controlled with over-the-counter Prilosec. She has successfully completed her preoperative workup. No concerns or psychological nutrition evaluation.  Reviewed her imaging which showed a normal upper GI series. She has somewhat elevated lipids and slightly low vitamin D level.    Problem List/Past Medical Bianca Salina T. Darold Miley, MD; 08/25/2017 10:45 AM) Acid Reflux / GERD  MORBID OBESITY, UNSPECIFIED OBESITY TYPE (E66.01)   Past Surgical History Bianca Salina T. Cala Kruckenberg, MD; 08/25/2017 10:45 AM) Oral Surgery  Small Bowel Surgery  Ovary Removal - Left  Cholecystectomy  Colon Polyp Removal - Colonoscopy   Diagnostic Studies History Bianca Salina T. Gabrelle Roca, MD; 08/25/2017 10:45 AM) Colonoscopy  Mammogram  1 to 3 yrs ago  Allergies Judithann Sauger, RMA; 08/25/2017 10:24 AM) No Known Drug  Allergies [01/25/2017]: Allergies Reconciled   Medication History Bianca Salina T. Milon Dethloff, MD; 08/25/2017 10:45 AM) Vitamin D (Cholecalciferol) (1000UNIT Capsule, Oral) Active. Medications Reconciled Vitamin B-12 ( Tablet, Oral) Active. PriLOSEC (  Capsule DR, Oral) Active.  Family History Bianca Salina T. Saylor Sheckler, MD; 08/25/2017 10:45 AM) Diabetes Mellitus  Father. Migraine Headache  Son. Prostate Cancer   Vitals Judithann Sauger RMA; 08/25/2017 10:23 AM) 08/25/2017 10:23 AM Weight: 236.8 lb Height: 64in Body Surface Area: 2.1 m Body Mass Index: 40.65 kg/m  Temp.: 98.45F  Pulse: 82 (Regular)  BP: 130/82 (Sitting, Left Arm, Standard)       Physical Exam Bianca Salina T. Brand Siever MD; 08/25/2017 10:46 AM) The physical exam findings are as follows: Note:General: Alert, obese Caucasian female, in no distress Skin: Warm and dry without rash or infection. HEENT: No palpable masses or thyromegaly. Sclera nonicteric. Pupils equal round and reactive. Oropharynx clear. Lymph nodes: No cervical, supraclavicular, or inguinal nodes palpable. Lungs: Breath sounds clear and equal. No wheezing or increased work of breathing. Cardiovascular: Regular rate and rhythm without murmer. No JVD or edema. Peripheral pulses intact. No carotid bruits. Abdomen: Nondistended. Soft and nontender. No masses palpable. No organomegaly. No palpable hernias. Healed low midline incision skirting just up above the umbilicus Extremities: No edema or joint swelling or deformity. No chronic venous stasis changes. Neurologic: Alert and fully oriented. Gait normal. No focal weakness. Psychiatric: Normal mood and affect. Thought content appropriate with normal judgement and insight    Assessment & Plan Bianca Salina T. Roshun Klingensmith MD; 08/25/2017 10:47 AM) MORBID OBESITY, UNSPECIFIED OBESITY TYPE (E66.01) Impression: Patient with progressive morbid obesity unresponsive to multiple efforts at medical  management who presents with a BMI of 41 and comorbidities of prediabetes, joint pain, and GERD. I believe there would be very significant medical  benefit from surgical weight loss. we discussed surgical options in detail. We discussed that gastric bypass would be more effective treatment for her reflux but that with her extensive abdominal surgery intestinal adhesions could make this surgery difficult or risky. Her reflux is not severe and well controlled with over-the-counter medications and I do not think is a contraindication to sleeve gastrectomy. After our discussion of surgical options currently available the patient has decided to proceed with laparoscopic sleeve gastrectomy.. We have discussed the nature of the problem and the risks of remaining morbidly obese. We discussed laparoscopic sleeve gastrectomy in detail including the nature of the procedure, expected hospitalization and recovery, and major risks of anesthetic complications, bleeding, blood clots and pulmonary emboli, leakage and infection and long-term risks of stricture, , bowel obstruction, reflux, nutritional deficiencies, and failure to lose weight or weight regain. We discussed these problems could lead to death. We discussed that the procedure is not reversible. We discussed possible need for conversion to gastric bypass or other procedure. She is given prescriptions for Protonix which she will start now and when necessary oxycodone and Zofran to have at home. All her questions were answered and we are ready to proceed with sleeve gastrectomy. History of Present Illness Bianca Salina T. Princeton Nabor MD; 08/25/2017 10:45 AM) The patient is a 62 year old female who presents with obesity. Patient returns for her preoperative visit prior to planned laparoscopic sleeve gastrectomy. Patient is referred by Dr Ranae Plumber for consideration for surgical treatment for morbid obesity. The patient gives a history of progressive obesity since early  adulthood despite innumerable attempts at medical management. She has been able to lose up to 70 pounds at a time but then experiences progressive weight regain. Obesity has been affecting the patient in a number of ways including fatigue and shortness of breath with exertion and joint pain which significantly limits her activities. Significant co-morbid illnesses have begun to develop including prediabetes and GERD. The patient has been to our initial information seminar, researched surgical options thoroughly, and is interested in sleeve gastrectomy. she has significant surgical history of laparoscopic cholecystectomy in 2005 and then emergency resection for volvulus in 2013. She does have some moderate reflux which is controlled with over-the-counter Prilosec. She has successfully completed her preoperative workup. No concerns or psychological nutrition evaluation.  Reviewed her imaging which showed a normal upper GI series. She has somewhat elevated lipids and slightly low vitamin D level.    Problem List/Past Medical Bianca Salina T. Mehtaab Mayeda, MD; 08/25/2017 10:45 AM) Acid Reflux / GERD  MORBID OBESITY, UNSPECIFIED OBESITY TYPE (E66.01)   Past Surgical History Bianca Salina T. Stephie Xu, MD; 08/25/2017 10:45 AM) Oral Surgery  Small Bowel Surgery  Ovary Removal - Left  Cholecystectomy  Colon Polyp Removal - Colonoscopy   Diagnostic Studies History Bianca Salina T. Amarilis Belflower, MD; 08/25/2017 10:45 AM) Colonoscopy  Mammogram  1 to 3 yrs ago  Allergies Judithann Sauger, RMA; 08/25/2017 10:24 AM) No Known Drug Allergies [01/25/2017]: Allergies Reconciled   Medication History Bianca Salina T. Jahira Swiss, MD; 08/25/2017 10:45 AM) Vitamin D (Cholecalciferol) (1000UNIT Capsule, Oral) Active. Medications Reconciled Vitamin B-12 ( Tablet, Oral) Active. PriLOSEC (  Capsule DR, Oral) Active.  Family History Bianca Salina T. Yavuz Kirby, MD; 08/25/2017 10:45 AM) Diabetes Mellitus   Father. Migraine Headache  Son. Prostate Cancer   Vitals Judithann Sauger RMA; 08/25/2017 10:23 AM) 08/25/2017 10:23 AM Weight: 236.8 lb Height: 64in Body Surface Area: 2.1 m Body Mass Index: 40.65 kg/m  Temp.: 98.84F  Pulse: 82 (Regular)  BP: 130/82 (Sitting,  Left Arm, Standard)       Physical Exam Bianca Salina T. Landen Breeland MD; 08/25/2017 10:46 AM) The physical exam findings are as follows: Note:General: Alert, obese Caucasian female, in no distress Skin: Warm and dry without rash or infection. HEENT: No palpable masses or thyromegaly. Sclera nonicteric. Pupils equal round and reactive. Oropharynx clear. Lymph nodes: No cervical, supraclavicular, or inguinal nodes palpable. Lungs: Breath sounds clear and equal. No wheezing or increased work of breathing. Cardiovascular: Regular rate and rhythm without murmer. No JVD or edema. Peripheral pulses intact. No carotid bruits. Abdomen: Nondistended. Soft and nontender. No masses palpable. No organomegaly. No palpable hernias. Healed low midline incision skirting just up above the umbilicus Extremities: No edema or joint swelling or deformity. No chronic venous stasis changes. Neurologic: Alert and fully oriented. Gait normal. No focal weakness. Psychiatric: Normal mood and affect. Thought content appropriate with normal judgement and insight    Assessment & Plan Bianca Salina T. Embree Brawley MD; 08/25/2017 10:47 AM) MORBID OBESITY, UNSPECIFIED OBESITY TYPE (E66.01) Impression: Patient with progressive morbid obesity unresponsive to multiple efforts at medical management who presents with a BMI of 41 and comorbidities of prediabetes, joint pain, and GERD. I believe there would be very significant medical benefit from surgical weight loss. we discussed surgical options in detail. We discussed that gastric bypass would be more effective treatment for her reflux but that with her extensive abdominal surgery intestinal adhesions could make this  surgery difficult or risky. Her reflux is not severe and well controlled with over-the-counter medications and I do not think is a contraindication to sleeve gastrectomy. After our discussion of surgical options currently available the patient has decided to proceed with laparoscopic sleeve gastrectomy.. We have discussed the nature of the problem and the risks of remaining morbidly obese. We discussed laparoscopic sleeve gastrectomy in detail including the nature of the procedure, expected hospitalization and recovery, and major risks of anesthetic complications, bleeding, blood clots and pulmonary emboli, leakage and infection and long-term risks of stricture, , bowel obstruction, reflux, nutritional deficiencies, and failure to lose weight or weight regain. We discussed these problems could lead to death. We discussed that the procedure is not reversible. We discussed possible need for conversion to gastric bypass or other procedure. She is given prescriptions for Protonix which she will start now and when necessary oxycodone and Zofran to have at home. All her questions were answered and we are ready to proceed with sleeve gastrectomy.

## 2017-09-11 ENCOUNTER — Inpatient Hospital Stay (HOSPITAL_COMMUNITY)
Admission: RE | Admit: 2017-09-11 | Discharge: 2017-09-12 | DRG: 621 | Disposition: A | Discharge status: Home / Self Care | Payer: BC Managed Care – PPO | Source: Ambulatory Visit | Attending: General Surgery | Admitting: General Surgery

## 2017-09-11 ENCOUNTER — Inpatient Hospital Stay (HOSPITAL_COMMUNITY): Payer: BC Managed Care – PPO | Admitting: Anesthesiology

## 2017-09-11 ENCOUNTER — Encounter (HOSPITAL_COMMUNITY)
Admission: RE | Disposition: A | Discharge status: Home / Self Care | Payer: Self-pay | Source: Ambulatory Visit | Attending: General Surgery

## 2017-09-11 ENCOUNTER — Encounter (HOSPITAL_COMMUNITY): Payer: Self-pay | Admitting: *Deleted

## 2017-09-11 ENCOUNTER — Other Ambulatory Visit: Payer: Self-pay

## 2017-09-11 DIAGNOSIS — K219 Gastro-esophageal reflux disease without esophagitis: Secondary | ICD-10-CM | POA: Diagnosis present

## 2017-09-11 DIAGNOSIS — M2550 Pain in unspecified joint: Secondary | ICD-10-CM | POA: Diagnosis present

## 2017-09-11 DIAGNOSIS — Z9049 Acquired absence of other specified parts of digestive tract: Secondary | ICD-10-CM | POA: Diagnosis not present

## 2017-09-11 DIAGNOSIS — K432 Incisional hernia without obstruction or gangrene: Secondary | ICD-10-CM | POA: Diagnosis present

## 2017-09-11 DIAGNOSIS — Z6841 Body Mass Index (BMI) 40.0 and over, adult: Secondary | ICD-10-CM | POA: Diagnosis not present

## 2017-09-11 DIAGNOSIS — Z833 Family history of diabetes mellitus: Secondary | ICD-10-CM

## 2017-09-11 DIAGNOSIS — K449 Diaphragmatic hernia without obstruction or gangrene: Secondary | ICD-10-CM | POA: Diagnosis present

## 2017-09-11 DIAGNOSIS — E559 Vitamin D deficiency, unspecified: Secondary | ICD-10-CM | POA: Diagnosis present

## 2017-09-11 DIAGNOSIS — R7303 Prediabetes: Secondary | ICD-10-CM | POA: Diagnosis present

## 2017-09-11 HISTORY — PX: LAPAROSCOPIC GASTRIC SLEEVE RESECTION: SHX5895

## 2017-09-11 HISTORY — PX: HIATAL HERNIA REPAIR: SHX195

## 2017-09-11 LAB — TYPE AND SCREEN
ABO/RH(D): O POS
ANTIBODY SCREEN: NEGATIVE

## 2017-09-11 LAB — HEMOGLOBIN AND HEMATOCRIT, BLOOD
HCT: 41.9 % (ref 36.0–46.0)
Hemoglobin: 13.8 g/dL (ref 12.0–15.0)

## 2017-09-11 SURGERY — GASTRECTOMY, SLEEVE, LAPAROSCOPIC
Anesthesia: General | Site: Abdomen

## 2017-09-11 MED ORDER — LIDOCAINE 2% (20 MG/ML) 5 ML SYRINGE: INTRAMUSCULAR | Status: DC | PRN

## 2017-09-11 MED ORDER — OXYCODONE HCL 5 MG/5ML PO SOLN: 5.0000 mg | ORAL | Status: DC | PRN

## 2017-09-11 MED ORDER — LACTATED RINGERS IV SOLN
INTRAVENOUS | Status: DC | PRN
  Administered 2017-09-11: 07:00:00 via INTRAVENOUS

## 2017-09-11 MED ORDER — PROPOFOL 10 MG/ML IV BOLUS
INTRAVENOUS | Status: AC
  Filled 2017-09-11: qty 20

## 2017-09-11 MED ORDER — SODIUM CHLORIDE 0.9 % IJ SOLN
INTRAMUSCULAR | Status: AC
  Filled 2017-09-11: qty 50

## 2017-09-11 MED ORDER — FENTANYL CITRATE (PF) 250 MCG/5ML IJ SOLN
INTRAMUSCULAR | Status: AC
  Filled 2017-09-11: qty 5

## 2017-09-11 MED ORDER — MIDAZOLAM HCL 2 MG/2ML IJ SOLN
INTRAMUSCULAR | Status: AC
  Filled 2017-09-11: qty 2

## 2017-09-11 MED ORDER — DEXAMETHASONE SODIUM PHOSPHATE 10 MG/ML IJ SOLN
INTRAMUSCULAR | Status: AC
  Filled 2017-09-11: qty 1

## 2017-09-11 MED ORDER — MORPHINE SULFATE (PF) 2 MG/ML IV SOLN: 1.0000 mg | INTRAVENOUS | Status: DC | PRN

## 2017-09-11 MED ORDER — MIDAZOLAM HCL 2 MG/2ML IJ SOLN
INTRAMUSCULAR | Status: DC | PRN
  Administered 2017-09-11: 2 mg via INTRAVENOUS

## 2017-09-11 MED ORDER — EVICEL 5 ML EX KIT
PACK | Freq: Once | CUTANEOUS | Status: AC
  Administered 2017-09-11: 1
  Filled 2017-09-11: qty 1

## 2017-09-11 MED ORDER — BUPIVACAINE HCL (PF) 0.25 % IJ SOLN
INTRAMUSCULAR | Status: DC | PRN
  Administered 2017-09-11: 30 mL

## 2017-09-11 MED ORDER — PROPOFOL 10 MG/ML IV BOLUS
INTRAVENOUS | Status: DC | PRN
  Administered 2017-09-11: 50 mg via INTRAVENOUS

## 2017-09-11 MED ORDER — ACETAMINOPHEN 500 MG PO TABS
1000.0000 mg | ORAL_TABLET | ORAL | Status: AC
  Administered 2017-09-11: 1000 mg via ORAL
  Filled 2017-09-11: qty 2

## 2017-09-11 MED ORDER — ROCURONIUM BROMIDE 10 MG/ML (PF) SYRINGE
PREFILLED_SYRINGE | INTRAVENOUS | Status: AC
  Filled 2017-09-11: qty 5

## 2017-09-11 MED ORDER — HYDROMORPHONE HCL 1 MG/ML IJ SOLN: 0.2500 mg | INTRAMUSCULAR | Status: DC | PRN

## 2017-09-11 MED ORDER — FENTANYL CITRATE (PF) 250 MCG/5ML IJ SOLN
INTRAMUSCULAR | Status: DC | PRN
  Administered 2017-09-11: 100 ug via INTRAVENOUS
  Administered 2017-09-11 (×3): 50 ug via INTRAVENOUS

## 2017-09-11 MED ORDER — LACTATED RINGERS IR SOLN
Status: DC | PRN
  Administered 2017-09-11: 1000 mL

## 2017-09-11 MED ORDER — ENOXAPARIN SODIUM 30 MG/0.3ML ~~LOC~~ SOLN
30.0000 mg | Freq: Two times a day (BID) | SUBCUTANEOUS | Status: DC
  Administered 2017-09-11: 30 mg via SUBCUTANEOUS
  Filled 2017-09-11 (×2): qty 0.3

## 2017-09-11 MED ORDER — ONDANSETRON HCL 4 MG/2ML IJ SOLN
INTRAMUSCULAR | Status: DC | PRN
  Administered 2017-09-11: 4 mg via INTRAVENOUS

## 2017-09-11 MED ORDER — OXYCODONE HCL 5 MG PO TABS: 5.0000 mg | ORAL_TABLET | Freq: Once | ORAL | Status: DC | PRN

## 2017-09-11 MED ORDER — LIDOCAINE 2% (20 MG/ML) 5 ML SYRINGE
INTRAMUSCULAR | Status: DC | PRN
  Administered 2017-09-11: 1.5 mg/kg/h via INTRAVENOUS

## 2017-09-11 MED ORDER — EPHEDRINE SULFATE-NACL 50-0.9 MG/10ML-% IV SOSY
PREFILLED_SYRINGE | INTRAVENOUS | Status: DC | PRN
  Administered 2017-09-11: 15 mg via INTRAVENOUS

## 2017-09-11 MED ORDER — CELECOXIB 200 MG PO CAPS
400.0000 mg | ORAL_CAPSULE | ORAL | Status: AC
  Administered 2017-09-11: 400 mg via ORAL
  Filled 2017-09-11: qty 2

## 2017-09-11 MED ORDER — HEPARIN SODIUM (PORCINE) 5000 UNIT/ML IJ SOLN
5000.0000 [IU] | INTRAMUSCULAR | Status: AC
  Administered 2017-09-11: 5000 [IU] via SUBCUTANEOUS
  Filled 2017-09-11: qty 1

## 2017-09-11 MED ORDER — LIDOCAINE HCL 2 % IJ SOLN
INTRAMUSCULAR | Status: AC
  Filled 2017-09-11: qty 20

## 2017-09-11 MED ORDER — 0.9 % SODIUM CHLORIDE (POUR BTL) OPTIME
TOPICAL | Status: DC | PRN
Start: 2017-09-11 — End: 2017-09-11
  Administered 2017-09-11: 1000 mL

## 2017-09-11 MED ORDER — ONDANSETRON HCL 4 MG/2ML IJ SOLN
INTRAMUSCULAR | Status: AC
  Filled 2017-09-11: qty 2

## 2017-09-11 MED ORDER — DEXAMETHASONE SODIUM PHOSPHATE 10 MG/ML IJ SOLN
INTRAMUSCULAR | Status: DC | PRN
  Administered 2017-09-11: 10 mg via INTRAVENOUS

## 2017-09-11 MED ORDER — APREPITANT 40 MG PO CAPS
40.0000 mg | ORAL_CAPSULE | ORAL | Status: AC
  Administered 2017-09-11: 40 mg via ORAL
  Filled 2017-09-11: qty 1

## 2017-09-11 MED ORDER — GABAPENTIN 300 MG PO CAPS
300.0000 mg | ORAL_CAPSULE | ORAL | Status: AC
  Administered 2017-09-11: 300 mg via ORAL
  Filled 2017-09-11: qty 1

## 2017-09-11 MED ORDER — CEFOTETAN DISODIUM-DEXTROSE 2-2.08 GM-%(50ML) IV SOLR
2.0000 g | INTRAVENOUS | Status: AC
  Administered 2017-09-11: 2 g via INTRAVENOUS
  Filled 2017-09-11: qty 50

## 2017-09-11 MED ORDER — CHLORHEXIDINE GLUCONATE CLOTH 2 % EX PADS: 6.0000 | MEDICATED_PAD | Freq: Once | CUTANEOUS | Status: DC

## 2017-09-11 MED ORDER — ONDANSETRON HCL 4 MG/2ML IJ SOLN: 4.0000 mg | INTRAMUSCULAR | Status: DC | PRN

## 2017-09-11 MED ORDER — PREMIER PROTEIN SHAKE
2.0000 [oz_av] | ORAL | Status: DC
  Administered 2017-09-12 (×2): 2 [oz_av] via ORAL

## 2017-09-11 MED ORDER — SODIUM CHLORIDE 0.9 % IJ SOLN: INTRAMUSCULAR | Status: DC | PRN

## 2017-09-11 MED ORDER — PANTOPRAZOLE SODIUM 40 MG PO TBEC
40.0000 mg | DELAYED_RELEASE_TABLET | Freq: Every day | ORAL | Status: DC
  Administered 2017-09-12: 40 mg via ORAL
  Filled 2017-09-11: qty 1

## 2017-09-11 MED ORDER — SUGAMMADEX SODIUM 500 MG/5ML IV SOLN
INTRAVENOUS | Status: AC
  Filled 2017-09-11: qty 5

## 2017-09-11 MED ORDER — LIDOCAINE 2% (20 MG/ML) 5 ML SYRINGE
INTRAMUSCULAR | Status: AC
  Filled 2017-09-11: qty 5

## 2017-09-11 MED ORDER — OXYCODONE HCL 5 MG/5ML PO SOLN
5.0000 mg | Freq: Once | ORAL | Status: DC | PRN
  Filled 2017-09-11: qty 5

## 2017-09-11 MED ORDER — ROCURONIUM BROMIDE 10 MG/ML (PF) SYRINGE
PREFILLED_SYRINGE | INTRAVENOUS | Status: DC | PRN
  Administered 2017-09-11: 50 mg via INTRAVENOUS
  Administered 2017-09-11 (×2): 10 mg via INTRAVENOUS

## 2017-09-11 MED ORDER — DEXAMETHASONE SODIUM PHOSPHATE 4 MG/ML IJ SOLN: 4.0000 mg | INTRAMUSCULAR | Status: DC

## 2017-09-11 MED ORDER — SODIUM CHLORIDE 0.9 % IJ SOLN
INTRAMUSCULAR | Status: DC | PRN
  Administered 2017-09-11: 50 mL

## 2017-09-11 MED ORDER — ACETAMINOPHEN 160 MG/5ML PO SOLN
650.0000 mg | Freq: Four times a day (QID) | ORAL | Status: DC
  Administered 2017-09-11 – 2017-09-12 (×3): 650 mg via ORAL
  Filled 2017-09-11 (×4): qty 20.3

## 2017-09-11 MED ORDER — BUPIVACAINE LIPOSOME 1.3 % IJ SUSP
20.0000 mL | Freq: Once | INTRAMUSCULAR | Status: AC
  Administered 2017-09-11: 20 mL
  Filled 2017-09-11: qty 20

## 2017-09-11 MED ORDER — SODIUM CHLORIDE 0.9 % IJ SOLN
INTRAMUSCULAR | Status: AC
  Filled 2017-09-11: qty 10

## 2017-09-11 MED ORDER — BUPIVACAINE HCL (PF) 0.25 % IJ SOLN
INTRAMUSCULAR | Status: AC
  Filled 2017-09-11: qty 60

## 2017-09-11 MED ORDER — KCL-LACTATED RINGERS-D5W 20 MEQ/L IV SOLN
INTRAVENOUS | Status: DC
  Administered 2017-09-11 (×2): via INTRAVENOUS
  Filled 2017-09-11 (×4): qty 1000

## 2017-09-11 MED ORDER — LIDOCAINE 2% (20 MG/ML) 5 ML SYRINGE
INTRAMUSCULAR | Status: DC | PRN
  Administered 2017-09-11: 100 mg via INTRAVENOUS

## 2017-09-11 MED ORDER — SUGAMMADEX SODIUM 200 MG/2ML IV SOLN
INTRAVENOUS | Status: DC | PRN
  Administered 2017-09-11: 250 mg via INTRAVENOUS

## 2017-09-11 MED ORDER — ONDANSETRON HCL 4 MG/2ML IJ SOLN: 4.0000 mg | Freq: Once | INTRAMUSCULAR | Status: DC | PRN

## 2017-09-11 MED ORDER — SCOPOLAMINE 1 MG/3DAYS TD PT72
1.0000 | MEDICATED_PATCH | TRANSDERMAL | Status: DC
  Administered 2017-09-11: 1.5 mg via TRANSDERMAL
  Filled 2017-09-11: qty 1

## 2017-09-11 SURGICAL SUPPLY — 55 items
APPLICATOR COTTON TIP 6IN STRL (MISCELLANEOUS) IMPLANT
APPLIER CLIP ROT 10 11.4 M/L (STAPLE)
APPLIER CLIP ROT 13.4 12 LRG (CLIP) ×2
BLADE SURG SZ11 CARB STEEL (BLADE) ×2 IMPLANT
CABLE HIGH FREQUENCY MONO STRZ (ELECTRODE) ×2 IMPLANT
CHLORAPREP W/TINT 26ML (MISCELLANEOUS) ×2 IMPLANT
CLIP APPLIE ROT 10 11.4 M/L (STAPLE) IMPLANT
CLIP APPLIE ROT 13.4 12 LRG (CLIP) ×1 IMPLANT
DERMABOND ADVANCED (GAUZE/BANDAGES/DRESSINGS) ×1
DERMABOND ADVANCED .7 DNX12 (GAUZE/BANDAGES/DRESSINGS) ×1 IMPLANT
DEVICE SUT QUICK LOAD TK 5 (STAPLE) ×4 IMPLANT
DEVICE SUT TI-KNOT TK 5X26 (MISCELLANEOUS) ×2 IMPLANT
DEVICE SUTURE ENDOST 10MM (ENDOMECHANICALS) ×2 IMPLANT
DRAPE UTILITY XL STRL (DRAPES) ×4 IMPLANT
ELECT REM PT RETURN 15FT ADLT (MISCELLANEOUS) ×2 IMPLANT
GAUZE SPONGE 4X4 12PLY STRL (GAUZE/BANDAGES/DRESSINGS) IMPLANT
GLOVE BIOGEL PI IND STRL 7.5 (GLOVE) ×1 IMPLANT
GLOVE BIOGEL PI INDICATOR 7.5 (GLOVE) ×1
GLOVE ECLIPSE 7.5 STRL STRAW (GLOVE) ×2 IMPLANT
GOWN STRL REUS W/TWL XL LVL3 (GOWN DISPOSABLE) ×8 IMPLANT
GRASPER SUT TROCAR 14GX15 (MISCELLANEOUS) IMPLANT
HOVERMATT SINGLE USE (MISCELLANEOUS) ×2 IMPLANT
KIT BASIN OR (CUSTOM PROCEDURE TRAY) ×2 IMPLANT
MARKER SKIN DUAL TIP RULER LAB (MISCELLANEOUS) ×2 IMPLANT
NEEDLE SPNL 22GX3.5 QUINCKE BK (NEEDLE) ×2 IMPLANT
PACK UNIVERSAL I (CUSTOM PROCEDURE TRAY) ×2 IMPLANT
RELOAD STAPLER BLUE 60MM (STAPLE) ×3 IMPLANT
RELOAD STAPLER GOLD 60MM (STAPLE) ×1 IMPLANT
RELOAD STAPLER GREEN 60MM (STAPLE) ×2 IMPLANT
SCISSORS LAP 5X45 EPIX DISP (ENDOMECHANICALS) IMPLANT
SET IRRIG TUBING LAPAROSCOPIC (IRRIGATION / IRRIGATOR) ×2 IMPLANT
SHEARS HARMONIC ACE PLUS 45CM (MISCELLANEOUS) ×2 IMPLANT
SLEEVE ADV FIXATION 5X100MM (TROCAR) ×2 IMPLANT
SLEEVE GASTRECTOMY 36FR VISIGI (MISCELLANEOUS) ×2 IMPLANT
SOLUTION ANTI FOG 6CC (MISCELLANEOUS) ×2 IMPLANT
SPONGE LAP 18X18 RF (DISPOSABLE) ×2 IMPLANT
STAPLER ECHELON LONG 60 440 (INSTRUMENTS) ×2 IMPLANT
STAPLER RELOAD BLUE 60MM (STAPLE) ×6
STAPLER RELOAD GOLD 60MM (STAPLE) ×2
STAPLER RELOAD GREEN 60MM (STAPLE) ×4
SUT MNCRL AB 4-0 PS2 18 (SUTURE) ×2 IMPLANT
SUT SURGIDAC NAB ES-9 0 48 120 (SUTURE) ×4 IMPLANT
SUT VIC AB 0 BRD 54 (SUTURE) IMPLANT
SYR 10ML ECCENTRIC (SYRINGE) ×2 IMPLANT
SYR 20CC LL (SYRINGE) ×4 IMPLANT
SYSTEM WECK SHIELD CLOSURE (TROCAR) IMPLANT
TIP RIGID 35CM EVICEL (HEMOSTASIS) ×2 IMPLANT
TOWEL OR 17X26 10 PK STRL BLUE (TOWEL DISPOSABLE) ×2 IMPLANT
TOWEL OR NON WOVEN STRL DISP B (DISPOSABLE) ×2 IMPLANT
TROCAR ADV FIXATION 5X100MM (TROCAR) ×2 IMPLANT
TROCAR BLADELESS 15MM (ENDOMECHANICALS) ×2 IMPLANT
TROCAR BLADELESS OPT 5 100 (ENDOMECHANICALS) ×2 IMPLANT
TUBING CONNECTING 10 (TUBING) ×4 IMPLANT
TUBING ENDO SMARTCAP PENTAX (MISCELLANEOUS) ×2 IMPLANT
TUBING INSUF HEATED (TUBING) ×2 IMPLANT

## 2017-09-11 NOTE — Addendum Note (Signed)
Addendum  created 09/11/17 1905 by Kipp Brood, MD   Attestation recorded in [place], Intraprocedure Attestations filed

## 2017-09-11 NOTE — Progress Notes (Addendum)
Pt was taught how to use the incentive spirometer and demonstrated that she could reach up to 1000. Patient agreed to use the I.S. 10x each hour.   Patient started drinking water at 1411.

## 2017-09-11 NOTE — Interval H&P Note (Signed)
History and Physical Interval Note:  09/11/2017 7:17 AM  Bianca Fields  has presented today for surgery, with the diagnosis of MORBID OBESITY  The various methods of treatment have been discussed with the patient and family. After consideration of risks, benefits and other options for treatment, the patient has consented to  Procedure(s): LAPAROSCOPIC GASTRIC SLEEVE RESECTION WITH UPPER ENDO (N/A) as a surgical intervention .  The patient's history has been reviewed, patient examined, no change in status, stable for surgery.  I have reviewed the patient's chart and labs.  Questions were answered to the patient's satisfaction.     Lorne Skeens Yalanda Soderman

## 2017-09-11 NOTE — Op Note (Signed)
Preoperative diagnosis: laparoscopic sleeve gastrectomy  Postoperative diagnosis: Same   Procedure: Upper endoscopy   Surgeon: Luke Kinsinger, M.D.  Anesthesia: Gen.   Indications for procedure: This patient was undergoing a laparoscopic sleeve gastrectomy.   Description of procedure: The endoscopy was placed in the mouth and into the oropharynx and under endoscopic vision it was advanced to the esophagogastric junction. The pouch was insufflated and no bleeding or bubbles were seen. The GEJ was identified at 38 cm from the teeth. No bleeding or leaks were detected. The scope was withdrawn without difficulty.   Luke Kinsinger, M.D. General, Bariatric, & Minimally Invasive Surgery [facility]    

## 2017-09-11 NOTE — Progress Notes (Signed)
Patient alert and oriented.  Started fluids after meeting criteria of ambulation, voiding, and stable vital signs.  Only question regarding cramping feeling when drinking that quickly goes away.  We discussed this is a common occurrence.  No further questions at this time

## 2017-09-11 NOTE — Anesthesia Procedure Notes (Signed)
Procedure Name: Intubation Date/Time: 09/11/2017 7:34 AM Performed by: Florene Route, CRNA Patient Re-evaluated:Patient Re-evaluated prior to induction Oxygen Delivery Method: Circle system utilized Preoxygenation: Pre-oxygenation with 100% oxygen Induction Type: IV induction Ventilation: Mask ventilation without difficulty and Oral airway inserted - appropriate to patient size Laryngoscope Size: Hyacinth Meeker and 2 Grade View: Grade I Tube type: Oral Tube size: 7.5 mm Number of attempts: 1 Airway Equipment and Method: Stylet Placement Confirmation: ETT inserted through vocal cords under direct vision,  positive ETCO2 and breath sounds checked- equal and bilateral Secured at: 22 cm Tube secured with: Tape Dental Injury: Teeth and Oropharynx as per pre-operative assessment

## 2017-09-11 NOTE — Discharge Instructions (Signed)
° ° ° °GASTRIC BYPASS/SLEEVE ° Home Care Instructions ° ° These instructions are to help you care for yourself when you go home. ° °Call: If you have any problems. °• Call [phone] and ask for the surgeon on call °• If you need immediate help, come to the ER at [facility].  °• Tell the ER staff that you are a new post-op gastric bypass or gastric sleeve patient °  °Signs and symptoms to report: • Severe vomiting or nausea °o If you cannot keep down clear liquids for longer than 1 day, call your surgeon  °• Abdominal pain that does not get better after taking your pain medication °• Fever over 100.4° F with chills °• Heart beating over 100 beats a minute °• Shortness of breath at rest °• Chest pain °•  Redness, swelling, drainage, or foul odor at incision (surgical) sites °•  If your incisions open or pull apart °• Swelling or pain in calf (lower leg) °• Diarrhea (Loose bowel movements that happen often), frequent watery, uncontrolled bowel movements °• Constipation, (no bowel movements for 3 days) if this happens: Pick one °o Milk of Magnesia, 2 tablespoons by mouth, 3 times a day for 2 days if needed °o Stop taking Milk of Magnesia once you have a bowel movement °o Call your doctor if constipation continues °Or °o Miralax  (instead of Milk of Magnesia) following the label instructions °o Stop taking Miralax once you have a bowel movement °o Call your doctor if constipation continues °• Anything you think is not normal °  °Normal side effects after surgery: • Unable to sleep at night or unable to focus °• Irritability or moody °• Being tearful (crying) or depressed °These are common complaints, possibly related to your anesthesia medications that put you to sleep, stress of surgery, and change in lifestyle.  This usually goes away a few weeks after surgery.  If these feelings continue, call your primary care doctor. °  °Wound Care: You may have surgical glue, steri-strips, or staples over your incisions after  surgery °• Surgical glue:  Looks like a clear film over your incisions and will wear off a little at a time °• Steri-strips: Strips of tape over your incisions. You may notice a yellowish color on the skin under the steri-strips. This is used to make the   steri-strips stick better. Do not pull the steri-strips off - let them fall off °• Staples: Staples may be removed before you leave the hospital °o If you go home with staples, call [facility], [phone] at for an appointment with your surgeon’s nurse to have staples removed 10 days after surgery. °• Showering: You may shower two (2) days after your surgery unless your surgeon tells you differently °o Wash gently around incisions with warm soapy water, rinse well, and gently pat dry  °o No tub baths until staples are removed, steri-strips fall off or glue is gone.  °  °Medications: • Medications should be liquid or crushed if larger than the size of a dime °• Extended release pills (medication that release a little bit at a time through the day) should NOT be crushed or cut. (examples include XL, ER, DR, SR) °• Depending on the size and number of medications you take, you may need to space (take a few throughout the day)/change the time you take your medications so that you do not over-fill your pouch (smaller stomach) °• Make sure you follow-up with your primary care doctor to   make medication changes needed during rapid weight loss and life-style changes °• If you have diabetes, follow up with the doctor that orders your diabetes medication(s) within one week after surgery and check your blood sugar regularly. °• Do not drive while taking prescription pain medication  °• It is ok to take Tylenol by the bottle instructions with your pain medicine or instead of your pain medicine as needed.  DO NOT TAKE NSAIDS (EXAMPLES OF NSAIDS:  IBUPROFREN/ NAPROXEN)  °Diet:                    First 2 Weeks ° You will see the dietician t about two (2) weeks  after your surgery. The dietician will increase the types of foods you can eat if you are handling liquids well: °• If you have severe vomiting or nausea and cannot keep down clear liquids lasting longer than 1 day, call your surgeon @ ([phone]) °Protein Shake °• Drink at least 2 ounces of shake 5-6 times per day °• Each serving of protein shakes (usually 8 - 12 ounces) should have: °o 15 grams of protein  °o And no more than 5 grams of carbohydrate  °• Goal for protein each day: °o Men = 80 grams per day °o Women = 60 grams per day °• Protein powder may be added to fluids such as non-fat milk or Lactaid milk or unsweetened Soy/Almond milk (limit to 35 grams added protein powder per serving) ° °Hydration °• Slowly increase the amount of water and other clear liquids as tolerated (See Acceptable Fluids) °• Slowly increase the amount of protein shake as tolerated  °•  Sip fluids slowly and throughout the day.  Do not use straws. °• May use sugar substitutes in small amounts (no more than 6 - 8 packets per day; i.e. Splenda) ° °Fluid Goal °• The first goal is to drink at least 8 ounces of protein shake/drink per day (or as directed by the nutritionist); some examples of protein shakes are Syntrax Nectar, Adkins Advantage, EAS Edge HP, and Unjury. See handout from pre-op Bariatric Education Class: °o Slowly increase the amount of protein shake you drink as tolerated °o You may find it easier to slowly sip shakes throughout the day °o It is important to get your proteins in first °• Your fluid goal is to drink 64 - 100 ounces of fluid daily °o It may take a few weeks to build up to this °• 32 oz (or more) should be clear liquids  °And  °• 32 oz (or more) should be full liquids (see below for examples) °• Liquids should not contain sugar, caffeine, or carbonation ° °Clear Liquids: °• Water or Sugar-free flavored water (i.e. Fruit H2O, Propel) °• Decaffeinated coffee or tea (sugar-free) °• Crystal Lite, Wyler’s Lite,  Minute Maid Lite °• Sugar-free Jell-O °• Bouillon or broth °• Sugar-free Popsicle:   *Less than 20 calories each; Limit 1 per day ° °Full Liquids: °Protein Shakes/Drinks + 2 choices per day of other full liquids °• Full liquids must be: °o No More Than 15 grams of Carbs per serving  °o No More Than 3 grams of Fat per serving °• Strained low-fat cream soup (except Cream of Potato or Tomato) °• Non-Fat milk °• Fat-free Lactaid Milk °• Unsweetened Soy Or Unsweetened Almond Milk °• Low Sugar yogurt (Dannon Lite & Fit, Greek yogurt; Oikos Triple Zero; Chobani Simply 100; Yoplait 100 calorie Greek - No Fruit on the Bottom) ° °  °Vitamins   and Minerals • Start 1 day after surgery unless otherwise directed by your surgeon °• 2 Chewable Bariatric Specific Multivitamin / Multimineral Supplement with iron (Example: Bariatric Advantage Multi EA) °• Chewable Calcium with Vitamin D-3 °(Example: 3 Chewable Calcium Plus 600 with Vitamin D-3) °o Take 500 mg three (3) times a day for a total of 1500 mg each day °o Do not take all 3 doses of calcium at one time as it may cause constipation, and you can only absorb 500 mg  at a time  °o Do not mix multivitamins containing iron with calcium supplements; take 2 hours apart °• Menstruating women and those with a history of anemia (a blood disease that causes weakness) may need extra iron °o Talk with your doctor to see if you need more iron °• Do not stop taking or change any vitamins or minerals until you talk to your dietitian or surgeon °• Your Dietitian and/or surgeon must approve all vitamin and mineral supplements °  °Activity and Exercise: Limit your physical activity as instructed by your doctor.  It is important to continue walking at home.  During this time, use these guidelines: °• Do not lift anything greater than ten (10) pounds for at least two (2) weeks °• Do not go back to work or drive until your surgeon says you can °• You may have sex when you feel comfortable  °o It is  VERY important for female patients to use a reliable birth control method; fertility often increases after surgery  °o All hormonal birth control will be ineffective for 30 days after surgery due to medications given during surgery a barrier method must be used. °o Do not get pregnant for at least 18 months °• Start exercising as soon as your doctor tells you that you can °o Make sure your doctor approves any physical activity °• Start with a simple walking program °• Walk 5-15 minutes each day, 7 days per week.  °• Slowly increase until you are walking 30-45 minutes per day °Consider joining our BELT program. [phone] or email [email or web address] °  °Special Instructions Things to remember: °• Use your CPAP when sleeping if this applies to you ° °• [facility] has two free Bariatric Surgery Support Groups that meet monthly °o The 3rd Thursday of each month, 6 pm, [facility]  °o The 2nd Friday of each month, 11:45 am in the private dining room in the basement of [facility] °• It is very important to keep all follow up appointments with your surgeon, dietitian, primary care physician, and behavioral health practitioner °• Routine follow up schedule with your surgeon include appointments at 2-3 weeks, 6-8 weeks, 6 months, and 1 year at a minimum.  Your surgeon may request to see you more often.   °o After the first year, please follow up with your bariatric surgeon and dietitian at least once a year in order to maintain best weight loss results °[facility]: [phone] °[facility]: [phone] °Bariatric Nurse Coordinator: [phone] °  °   Reviewed and Endorsed  °by [facility] Patient Education Committee, June, 2016 °Edits Approved: Aug, 2018 ° ° ° °

## 2017-09-11 NOTE — Anesthesia Preprocedure Evaluation (Addendum)
Anesthesia Evaluation  Patient identified by MRN, date of birth, ID band Patient awake    Reviewed: Allergy & Precautions, NPO status , Patient's Chart, lab work & pertinent test results  Airway Mallampati: II  TM Distance: >3 FB Neck ROM: Full    Dental  (+) Teeth Intact, Dental Advisory Given   Pulmonary    breath sounds clear to auscultation       Cardiovascular  Rhythm:Regular Rate:Normal     Neuro/Psych    GI/Hepatic   Endo/Other    Renal/GU      Musculoskeletal   Abdominal   Peds  Hematology   Anesthesia Other Findings   Reproductive/Obstetrics                             Anesthesia Physical Anesthesia Plan  ASA: III  Anesthesia Plan: General   Post-op Pain Management:    Induction: Intravenous  PONV Risk Score and Plan: 1 and Ondansetron and Dexamethasone  Airway Management Planned: Oral ETT  Additional Equipment:   Intra-op Plan:   Post-operative Plan: Extubation in OR  Informed Consent: I have reviewed the patients History and Physical, chart, labs and discussed the procedure including the risks, benefits and alternatives for the proposed anesthesia with the patient or authorized representative who has indicated his/her understanding and acceptance.   Dental advisory given  Plan Discussed with: CRNA and Anesthesiologist  Anesthesia Plan Comments:         Anesthesia Quick Evaluation  

## 2017-09-11 NOTE — Anesthesia Postprocedure Evaluation (Signed)
Anesthesia Post Note  Patient: Bianca Fields  Procedure(s) Performed: LAPAROSCOPIC GASTRIC SLEEVE RESECTION WITH UPPER ENDO (N/A Abdomen) HERNIA REPAIR HIATAL (N/A Abdomen)     Patient location during evaluation: PACU Anesthesia Type: General Level of consciousness: awake and alert and oriented Pain management: pain level controlled Vital Signs Assessment: post-procedure vital signs reviewed and stable Respiratory status: spontaneous breathing, nonlabored ventilation, respiratory function stable and patient connected to nasal cannula oxygen Cardiovascular status: blood pressure returned to baseline and stable Postop Assessment: no apparent nausea or vomiting Anesthetic complications: no    Last Vitals:  Vitals:   09/11/17 1000 09/11/17 1015  BP: (!) 158/83 (!) 158/81  Pulse: 74 71  Resp: 20 16  Temp:  (!) 36.4 C  SpO2: 100% 95%    Last Pain:  Vitals:   09/11/17 1015  TempSrc:   PainSc: 3                  Sheikh Leverich A.

## 2017-09-11 NOTE — Op Note (Signed)
Preoperative Diagnosis: MORBID OBESITY  Postoprative Diagnosis: MORBID OBESITY  Procedure: Procedure(s): LAPAROSCOPIC GASTRIC SLEEVE RESECTION WITH UPPER ENDO HERNIA REPAIR HIATAL   Surgeon: Glenna Fellows T   Assistants: Feliciana Rossetti  Anesthesia:  General endotracheal anesthesia  Indications: Patient with progressive morbid obesity unresponsive to multiple efforts at medical management who presents with a BMI of 41 and comorbidities of prediabetes, joint pain, and GERD.  She has had previous open abdominal surgery including open cholecystectomy and colectomy for volvulus.  After extensive preoperative work-up and discussion detailed elsewhere we have elected to proceed with laparoscopic sleeve gastrectomy for treatment of her morbid obesity.  Procedure Detail: Patient was brought to the operating room, placed in the supine position on the operating table, and general endotracheal anesthesia induced.  She received preoperative IV antibiotics and subcutaneous heparin.  PAS were in place.  The abdomen was widely sterilely prepped and draped.  Patient timeout was performed and correct procedure verified.  Access was obtained with a 5 mm Optiview trocar in the left upper quadrant without difficulty and pneumoperitoneum established.  There was no evidence of trocar injury.  There were actually very minimal anterior abdominal wall adhesions that were lower in the abdomen.  There were a few small widemouth incisional hernias along the upper portion of her midline incision.  Under direct vision I placed a 5 mm trocar in the right upper quadrant, a 15 mm trocar in the right upper abdomen at the base of the falciform ligament and a 5 mm trocar just to the left of the umbilicus for the camera port.  Patient was placed in steep reverse Trendelenburg.  Through a 5 mm subxiphoid site the [facility] retractor was placed in the left lobe of the liver elevated with excellent exposure of the hiatus in the  stomach.  Finally an additional 5 mm trocar was placed laterally in the left upper quadrant.  Examination of the hiatus showed a moderate sized obvious hiatal hernia.  I began this dissection initially.  The anterior border of the right crus was dissected with harmonic scalpel and the distal esophagus mobilized and hernia sac reduced.  I then began to dissect the anterior border of the left crus.  The aorta was very prominent in the posterior aspect of the hiatus and the crura were very thin structures.  In order to more fully delineate the hiatus I elected to dissect the greater curve before repairing the repair.  Beginning at the mid greater curve the vasculature was dissected with the harmonic scalpel and the lesser sac entered.  The dissection progressed proximally dividing short gastrics with the harmonic scalpel.  The fundus was completely mobilized to the angle of Hiss and the left crus was completely dissected and distal esophagus further dissected.  We had plenty of distal esophagus that laying in the abdomen under no tension, about 5 cm.  The crura could be well seen from either side of the dissection.  Following this a posterior crural repair was performed with 2 interrupted 0 Ethibond sutures.  We then continued the greater curve dissection distally with harmonic scalpel until we dissected to 5 cm from the pylorus and the stomach was completely freed along its lesser curve vasculature with no posterior attachments.  The VISI G 25 French was passed under direct vision and its tip positioned in the pylorus and the tube along the curve and placed on suction.  The sleeve is started with an additional firing of the 60 mm green load stapler staying well  away from the lesser curve.  A second firing of the green load stapler was performed taking this up just proximal to the incisura again staying well away from it.  Following this the sleeve was completed with one firing of the gold load 60 mm stapler and 3  firings of the blue load 60 mm stapler caring the staple line somewhat closer to the VISI G-tube once we are past the incisura.  This created a nice smoothly tapering symmetrical sleeve without scalloping or twisting.  The sleeve was insufflated and under saline there was no evidence of leak.  Dr. Sheliah Hatch performed upper endoscopy again showing a smooth tapered sleeve with no narrowing or kinking and no bleeding and no leak.  Following this the staple line was coated with Evaseal.  I did apply hemoclips to a few small oozing points along the staple line.  The specimen was brought out through the 15 mm trocar site.  The Nathanson retractor was removed under direct vision.  The 15 mm trocar site was closed with a 0 Vicryl suture with the Weck closure device.  All CO2 is evacuated trochars removed.  Skin incisions were closed with some particular Monocryl and Dermabond.  Sponge needle and estimate counts were correct.    Findings: As above  Estimated Blood Loss:  Minimal         Drains: None  Blood Given: none          Specimens: Greater curvature of stomach        Complications:  * No complications entered in OR log *         Disposition: PACU - hemodynamically stable.         Condition: stable

## 2017-09-11 NOTE — Transfer of Care (Signed)
Immediate Anesthesia Transfer of Care Note  Patient: Khamila Bassinger  Procedure(s) Performed: LAPAROSCOPIC GASTRIC SLEEVE RESECTION WITH UPPER ENDO (N/A Abdomen) HERNIA REPAIR HIATAL (N/A Abdomen)  Patient Location: PACU  Anesthesia Type:General  Level of Consciousness: awake  Airway & Oxygen Therapy: Patient Spontanous Breathing and Patient connected to face mask oxygen  Post-op Assessment: Report given to RN and Post -op Vital signs reviewed and stable  Post vital signs: Reviewed and stable  Last Vitals:  Vitals Value Taken Time  BP    Temp    Pulse 71 09/11/2017  9:31 AM  Resp 16 09/11/2017  9:31 AM  SpO2 100 % 09/11/2017  9:31 AM  Vitals shown include unvalidated device data.  Last Pain:  Vitals:   09/11/17 0530  TempSrc: Oral      Patients Stated Pain Goal: 4 (09/11/17 0552)  Complications: No apparent anesthesia complications

## 2017-09-12 LAB — CBC WITH DIFFERENTIAL/PLATELET
BASOS ABS: 0 10*3/uL (ref 0.0–0.1)
BASOS PCT: 0 %
EOS ABS: 0 10*3/uL (ref 0.0–0.7)
Eosinophils Relative: 0 %
HEMATOCRIT: 36.8 % (ref 36.0–46.0)
HEMOGLOBIN: 12.1 g/dL (ref 12.0–15.0)
Lymphocytes Relative: 19 %
Lymphs Abs: 1.3 10*3/uL (ref 0.7–4.0)
MCH: 29.4 pg (ref 26.0–34.0)
MCHC: 32.9 g/dL (ref 30.0–36.0)
MCV: 89.3 fL (ref 78.0–100.0)
Monocytes Absolute: 0.5 10*3/uL (ref 0.1–1.0)
Monocytes Relative: 7 %
NEUTROS ABS: 5.1 10*3/uL (ref 1.7–7.7)
NEUTROS PCT: 74 %
Platelets: 174 10*3/uL (ref 150–400)
RBC: 4.12 MIL/uL (ref 3.87–5.11)
RDW: 13.7 % (ref 11.5–15.5)
WBC: 6.8 10*3/uL (ref 4.0–10.5)

## 2017-09-12 NOTE — Progress Notes (Signed)
Patient alert and oriented, pain is controlled. Patient is tolerating fluids, advanced to protein shake today, patient is tolerating well.  Reviewed Gastric sleeve discharge instructions with patient and patient is able to articulate understanding.  Provided information on BELT program, Support Group and [facility] outpatient pharmacy. All questions answered, will continue to monitor.  Patient tolerated 1185 ml of fluid in 24 hours Per dehydration protocol call back one week post op

## 2017-09-12 NOTE — Progress Notes (Signed)
Patient alert and oriented, Post op day 1.  Provided support and encouragement.  Encouraged pulmonary toilet, ambulation and small sips of liquids.  Completed 12 ounces of bari clear fluids and a protein shake.  All questions answered.  Will continue to monitor.

## 2017-09-12 NOTE — Progress Notes (Signed)
Discharge instructions explained to pt all questions were answered.

## 2017-09-12 NOTE — Discharge Summary (Signed)
  Patient ID: Bianca Fields [id number] 62 y.o. [birth year]  09/11/2017  Discharge date and time: 09/12/2017   Admitting Physician: Mariella Saa  Discharge Physician: Mariella Saa  Admission Diagnoses: MORBID OBESITY  Discharge Diagnoses: Same  Operations: Procedure(s): LAPAROSCOPIC GASTRIC SLEEVE RESECTION WITH UPPER ENDO HERNIA REPAIR HIATAL  Admission Condition: good  Discharged Condition: good  Indication for Admission: Patient with progressive morbid obesity unresponsive to multiple efforts at medical management who presents with a BMI of 41 and comorbidities of prediabetes, joint pain, and GERD.  After extensive preoperative work-up and discussion detailed elsewhere she was electively admitted following laparoscopic sleeve gastrectomy for treatment of her morbid obesity    Hospital Course: On the morning of admission the patient underwent an uneventful laparoscopic sleeve gastrectomy with repair of a hiatal hernia.  Her postoperative course was uncomplicated.  She had minimal pain and no nausea.  The following morning she is tolerating fluids and protein shakes.  Ambulatory.  Abdomen is soft and nontender.  Incisions healing well.  CBC normal.  She is felt ready for discharge.   Disposition: Home  Patient Instructions:  Allergies as of 09/12/2017      Reactions   Iodinated Diagnostic Agents Hives      Medication List    TAKE these medications   acetaminophen 500 MG tablet Commonly known as:  TYLENOL Take 1,000 mg by mouth every 8 (eight) hours as needed.   Calcium-Vitamin D-Vitamin K 500-100-40 MG-UNT-MCG Chew Chew 1 each by mouth 3 (three) times daily.   multivitamin tablet Take 1 tablet by mouth daily.   pantoprazole 40 MG tablet Commonly known as:  PROTONIX Take 40 mg by mouth daily.   vitamin B-12 1000 MCG tablet Commonly known as:  CYANOCOBALAMIN Take 1,000 mcg by mouth daily.   Vitamin D (Ergocalciferol) 50000 units Caps  capsule Commonly known as:  DRISDOL Take 50,000 Units by mouth every 7 (seven) days. Wednesdays What changed:  Another medication with the same name was removed. Continue taking this medication, and follow the directions you see here.       Activity: activity as tolerated Diet: Bariatric protein shakes Wound Care: none needed  Follow-up:  With Dr. Johna Sheriff in 3 weeks.  Signed: Mariella Saa MD, FACS  09/12/2017, 7:09 AM

## 2017-09-12 NOTE — Plan of Care (Signed)

## 2017-09-12 NOTE — Progress Notes (Signed)
Patient ID: Bianca Fields, female   DOB: 1955/08/30, 62 y.o.   MRN: [id number] 1 Day Post-Op   Subjective: No complaints this morning.  Ambulatory.  Denies pain or nausea.  Tolerating water and protein shakes.  Objective: Vital signs in last 24 hours: Temp:  [97.3 F (36.3 C)-98.6 F (37 C)] 97.8 F (36.6 C) (04/30 9528) Pulse Rate:  [52-74] 52 (04/30 0608) Resp:  [13-20] 18 (04/30 4132) BP: (113-158)/(60-89) 113/60 (04/30 0608) SpO2:  [95 %-100 %] 96 % (04/30 0608) Weight:  [104.5 kg (230 lb 6.1 oz)] 104.5 kg (230 lb 6.1 oz) (04/30 4401) Last BM Date: 09/10/17  Intake/Output from previous day: 04/29 0701 - 04/30 0700 In: 4103.3 [P.O.:1185; I.V.:2868.3; IV Piggyback:50] Out: 3400 [Urine:3350; Blood:50] Intake/Output this shift: No intake/output data recorded.  General appearance: alert, cooperative and no distress GI: normal findings: soft, non-tender Incision/Wound: No erythema or drainage  Lab Results:  Recent Labs    09/11/17 1335 09/12/17 0532  WBC  --  6.8  HGB 13.8 12.1  HCT 41.9 36.8  PLT  --  174   BMET No results for input(s): NA, K, CL, CO2, GLUCOSE, BUN, CREATININE, CALCIUM in the last 72 hours.   Studies/Results: No results found.  Anti-infectives: Anti-infectives (From admission, onward)   Start     Dose/Rate Route Frequency Ordered Stop   09/11/17 0534  cefoTEtan in Dextrose 5% (CEFOTAN) IVPB 2 g     2 g Intravenous On call to O.R. 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  Plan discharge today.   LOS: 1 day    Mariella Saa 09/12/2017

## 2017-09-18 ENCOUNTER — Telehealth (HOSPITAL_COMMUNITY): Payer: Self-pay

## 2017-09-18 NOTE — Telephone Encounter (Signed)
Patient called to discuss post bariatric surgery follow up questions.  See below:   1.  Tell me about your pain and pain management?is not having pain has not needed medication  2.  Let's talk about fluid intake.  How much total fluid are you taking in?60-80 ounces of fluid  3.  How much protein have you taken in the last 2 days?60 grams  4.  Have you had nausea?  Tell me about when have experienced nausea and what you did to help?no nausea  5.  Has the frequency or color changed with your urine?frequent urine light in color  6.  Tell me what your incisions look like?incisisons look good no reported issues  7.  Have you been passing gas? BM?passing gas has had bm using miralax every couple of days in water  8.  If a problem or question were to arise who would you call?  Do you know contact numbers for [facility], [facility], and [facility]?is aware of contact numbers provided at discharge  9.  How has the walking going?walking every hour, went to mall today and walked for 20 minutes  10.  How are your vitamins and calcium going?  How are you taking them?patient able to verbalize the correct way to take both MVI and Calcium  Discussed Post op visit with Connye Burkitt at [facility] for diet progression.  No further questions

## 2017-09-26 ENCOUNTER — Encounter: Payer: Self-pay | Admitting: Dietician

## 2017-09-26 ENCOUNTER — Encounter: Payer: BC Managed Care – PPO | Attending: General Surgery | Admitting: Dietician

## 2017-09-26 VITALS — Ht 64.0 in | Wt 220.8 lb

## 2017-09-26 DIAGNOSIS — Z6837 Body mass index (BMI) 37.0-37.9, adult: Secondary | ICD-10-CM

## 2017-09-26 DIAGNOSIS — Z713 Dietary counseling and surveillance: Secondary | ICD-10-CM | POA: Insufficient documentation

## 2017-09-26 DIAGNOSIS — E6609 Other obesity due to excess calories: Secondary | ICD-10-CM

## 2017-09-26 NOTE — Progress Notes (Signed)
  Follow-up visit:  2 Weeks Post-Operative Sleeve Gastrectomy Surgery  Medical Nutrition Therapy:  Appt start time: 1030 end time:  1115.  Primary concerns today: Post-operative Bariatric Surgery Nutrition Management.  Preferred Learning Style:   No preference indicated   Learning Readiness:   Change in progress  24-hr recall: Breakfast: protein shake, sugar free mint  Snack: none; drinks fluids Lunch: sugar free jello or broth Snack: none, drinks fluids  Dinner: protein shake Snack: none, drinks fluids  Fluid intake: water, flavored water, caffeine-free tea 64oz or more daily Estimated total protein intake: 60g  Medications: acetaminophen as needed, pantroprazole Supplementation: bariatric multivitamins Celebrate 2x daily + Calcium chews  3x daily  Using straws: no Drinking while eating: none Hair loss: no Carbonated beverages: no N/V/D/C: no Dumping syndrome: no   Recent physical activity:  Walking indoors 15 minutes 4-5x a week  Progress Towards Goal(s):  In progress.  Handouts given during visit include:  Phase III and Phase IV bariatric diet   Nutritional Diagnosis:  [place]-3.3 Overweight/obesity As related to history of excess calories and inactivity.  As evidenced by Patient with BMI of 37, 2 weeks post-sleeve gastrectomy, following bariatric diet.    Intervention:  Nutrition counseling following weight loss surgery.   Patient is following post-op diet closely, and denies any significant side effects.    She voices readiness to advance her diet to solid foods.    She has a good understanding of next diet stages.   Teaching Method Utilized:  Visual Auditory Hands on  Barriers to learning/adherence to lifestyle change: none  Demonstrated degree of understanding via:  Teach Back   Monitoring/Evaluation:  Dietary intake, exercise, and body weight. Follow up in 2 months for 4 month post-op visit.

## 2017-09-26 NOTE — Patient Instructions (Signed)
   Begin small portions 1/4 cup or 1oz of lean, moist protein foods 2-3 times a day for meals.   Concentrate on plenty of fluids between meals, waiting at least 30 minutes after eating.   Use bariatric app to monitor protein and fluid intake.  Once able to eat more than 1oz of protein food at a time, it will be ok to try some softly cooked low-carb vegetables. Always eat protein first though.

## 2017-12-05 ENCOUNTER — Encounter: Payer: Self-pay | Admitting: Dietician

## 2017-12-05 ENCOUNTER — Encounter: Payer: BC Managed Care – PPO | Attending: General Surgery | Admitting: Dietician

## 2017-12-05 VITALS — Ht 64.0 in | Wt 203.8 lb

## 2017-12-05 DIAGNOSIS — Z713 Dietary counseling and surveillance: Secondary | ICD-10-CM | POA: Insufficient documentation

## 2017-12-05 DIAGNOSIS — E6609 Other obesity due to excess calories: Secondary | ICD-10-CM | POA: Diagnosis not present

## 2017-12-05 DIAGNOSIS — Z6834 Body mass index (BMI) 34.0-34.9, adult: Secondary | ICD-10-CM | POA: Diagnosis not present

## 2017-12-05 NOTE — Progress Notes (Signed)
Follow-up visit:  2 Months Post-Operative Sleeve Gastrectomy Surgery  Medical Nutrition Therapy:  Appt start time: 1030 end time:  1115.  Primary concerns today: Post-operative Bariatric Surgery Nutrition Management.  Preferred Learning Style:   Visual    Learning Readiness:   Change in progress  Weight: 203.8lbs Height: 5'4" Weight at previous [facility] visit: 220.8lbs -- weight loss of 17lbs in the past 6 weeks.   Dietary recall: Breakfast: yogurt, or bacon, or gluten free, sugar free high protein pancake with sugar free syrup; occasional protein shake Snack: none or fruit or simply jif peanut butter 1 Tbsp  Lunch: grilled chicken often, broiled fish, sirloin; some veg in small portions i.e green beans, salad Snack: occasionally fruit berries, tasted watermelon  Dinner: same as lunch Snack: sometimes protein shake if needed to meet protein goal   Patient reports some taste changes since surgery, and at this point does not like eggs or much cheese.   She tried some sugar free hard candy but has stopped when realizing it had some carbohydrate; discussed sugar alcohols in foods and potential GI effects.   She had a weight loss plateau several weeks ago, but hs increased exercise and added more variety of foods into her diet and weight loss has resumed.   Fluid intake: close to 64oz -- water, flavored water, unsweet tea, occasional coffee Estimated total protein intake:  60grams average Average 760kcal daily  Medications: acetaminophen as needed Supplementation: bariatric multivitamin 2x daily; calcium 3x daily; vitamin B12 1x daily; vitamin D 1x weekly (Rx runs out this week)  Using straws: sometimes; denies any issues  Drinking while eating: no except occasional sip if needed Hair loss: none Carbonated beverages: none N/V/D/C: none Dumping syndrome: no   Recent physical activity:  Walking indoors at mall 60 minutes 4-5x a week  Progress Towards Goal(s):  In  progress.  Handouts given during visit include:  Phase V and Phase VI bariatric diet stages   Nutritional Diagnosis:  [place]-3.3 Overweight/obesity As related to history of excess calories and inactivity.  As evidenced by patient with current BMI of 34.98, in weight loss process 2 months after bariatric surgery.    Intervention:  Reviewed patient's progress with diet and weight loss.   Instructed patient on guidelines for advancing diet to next stages over the next several months.    Patient continues to follow diet guidelines and is progressing well with her weight loss goal.   Teaching Method Utilized:  Visual Auditory Hands on  Barriers to learning/adherence to lifestyle change: none  Demonstrated degree of understanding via:  Teach Back   Monitoring/Evaluation:  Dietary intake, exercise, and body weight. Follow up in 4 months for 6 month post-op visit.

## 2017-12-05 NOTE — Patient Instructions (Signed)
   Continue to increase variety of foods from vegetable and fruit groups, only after eating protein source.  Consider other protein options for more variety, such as powdered peanut butter and unflavored protein powder that can be added into milk or other foods.   Great job so far, both with food choices and exercise! Keep up the great work!

## 2018-01-09 ENCOUNTER — Other Ambulatory Visit: Payer: Self-pay | Admitting: Obstetrics & Gynecology

## 2018-01-09 DIAGNOSIS — Z1231 Encounter for screening mammogram for malignant neoplasm of breast: Secondary | ICD-10-CM

## 2018-02-13 ENCOUNTER — Ambulatory Visit
Admission: RE | Admit: 2018-02-13 | Discharge: 2018-02-13 | Disposition: A | Discharge status: Home / Self Care | Payer: BC Managed Care – PPO | Source: Ambulatory Visit | Attending: Obstetrics & Gynecology | Admitting: Obstetrics & Gynecology

## 2018-02-13 DIAGNOSIS — Z1231 Encounter for screening mammogram for malignant neoplasm of breast: Secondary | ICD-10-CM | POA: Diagnosis present

## 2018-04-03 ENCOUNTER — Ambulatory Visit: Payer: BC Managed Care – PPO | Admitting: Dietician

## 2018-04-05 ENCOUNTER — Encounter: Payer: Self-pay | Admitting: Dietician

## 2018-04-05 NOTE — Progress Notes (Signed)
Patient cancelled her 6022-month post-op visit on 04/03/18 as she has no further coverage reimbursement for MNT. Sent letter to referring provider.

## 2019-01-15 ENCOUNTER — Other Ambulatory Visit: Payer: Self-pay | Admitting: Obstetrics & Gynecology

## 2019-01-15 DIAGNOSIS — Z1231 Encounter for screening mammogram for malignant neoplasm of breast: Secondary | ICD-10-CM

## 2019-03-05 ENCOUNTER — Ambulatory Visit
Admission: RE | Admit: 2019-03-05 | Discharge: 2019-03-05 | Disposition: A | Discharge status: Home / Self Care | Payer: BC Managed Care – PPO | Source: Ambulatory Visit | Attending: Obstetrics & Gynecology | Admitting: Obstetrics & Gynecology

## 2019-03-05 DIAGNOSIS — Z1231 Encounter for screening mammogram for malignant neoplasm of breast: Secondary | ICD-10-CM | POA: Diagnosis not present

## 2019-03-25 ENCOUNTER — Encounter (HOSPITAL_COMMUNITY): Payer: Self-pay

## 2020-01-16 ENCOUNTER — Other Ambulatory Visit: Payer: Self-pay | Admitting: Obstetrics & Gynecology

## 2020-01-16 DIAGNOSIS — Z1231 Encounter for screening mammogram for malignant neoplasm of breast: Secondary | ICD-10-CM

## 2020-03-05 ENCOUNTER — Other Ambulatory Visit: Payer: Self-pay

## 2020-03-05 ENCOUNTER — Ambulatory Visit
Admission: RE | Admit: 2020-03-05 | Discharge: 2020-03-05 | Disposition: A | Discharge status: Home / Self Care | Payer: BC Managed Care – PPO | Source: Ambulatory Visit | Attending: Obstetrics & Gynecology | Admitting: Obstetrics & Gynecology

## 2020-03-05 DIAGNOSIS — Z1231 Encounter for screening mammogram for malignant neoplasm of breast: Secondary | ICD-10-CM | POA: Diagnosis not present

## 2021-03-15 ENCOUNTER — Other Ambulatory Visit: Payer: Self-pay | Admitting: Infectious Diseases

## 2021-03-15 DIAGNOSIS — Z1231 Encounter for screening mammogram for malignant neoplasm of breast: Secondary | ICD-10-CM

## 2021-03-17 ENCOUNTER — Other Ambulatory Visit: Payer: Self-pay

## 2021-03-17 ENCOUNTER — Ambulatory Visit
Admission: RE | Admit: 2021-03-17 | Discharge: 2021-03-17 | Disposition: A | Discharge status: Home / Self Care | Payer: BC Managed Care – PPO | Source: Ambulatory Visit | Attending: Infectious Diseases | Admitting: Infectious Diseases

## 2021-03-17 DIAGNOSIS — Z1231 Encounter for screening mammogram for malignant neoplasm of breast: Secondary | ICD-10-CM | POA: Insufficient documentation

## 2022-02-23 ENCOUNTER — Other Ambulatory Visit: Payer: Self-pay | Admitting: Infectious Diseases

## 2022-02-23 DIAGNOSIS — Z1231 Encounter for screening mammogram for malignant neoplasm of breast: Secondary | ICD-10-CM

## 2022-03-28 ENCOUNTER — Ambulatory Visit
Admission: RE | Admit: 2022-03-28 | Discharge: 2022-03-28 | Disposition: A | Discharge status: Home / Self Care | Payer: Medicare PPO | Source: Ambulatory Visit | Attending: Infectious Diseases | Admitting: Infectious Diseases

## 2022-03-28 DIAGNOSIS — Z1231 Encounter for screening mammogram for malignant neoplasm of breast: Secondary | ICD-10-CM | POA: Diagnosis present

## 2023-03-15 ENCOUNTER — Other Ambulatory Visit: Payer: Self-pay | Admitting: Infectious Diseases

## 2023-03-15 DIAGNOSIS — Z1231 Encounter for screening mammogram for malignant neoplasm of breast: Secondary | ICD-10-CM

## 2023-04-07 ENCOUNTER — Ambulatory Visit
Admission: RE | Admit: 2023-04-07 | Discharge: 2023-04-07 | Disposition: A | Discharge status: Home / Self Care | Payer: Medicare PPO | Source: Ambulatory Visit | Attending: Infectious Diseases | Admitting: Infectious Diseases

## 2023-04-07 DIAGNOSIS — Z1231 Encounter for screening mammogram for malignant neoplasm of breast: Secondary | ICD-10-CM | POA: Diagnosis present

## 2023-05-04 ENCOUNTER — Other Ambulatory Visit: Payer: Self-pay | Admitting: Infectious Diseases

## 2023-05-04 DIAGNOSIS — Z0000 Encounter for general adult medical examination without abnormal findings: Secondary | ICD-10-CM

## 2023-05-04 DIAGNOSIS — Z136 Encounter for screening for cardiovascular disorders: Secondary | ICD-10-CM

## 2023-05-04 DIAGNOSIS — E782 Mixed hyperlipidemia: Secondary | ICD-10-CM

## 2023-05-16 ENCOUNTER — Ambulatory Visit
Admission: RE | Admit: 2023-05-16 | Discharge: 2023-05-16 | Disposition: A | Discharge status: Home / Self Care | Payer: Self-pay | Source: Ambulatory Visit | Attending: Infectious Diseases | Admitting: Infectious Diseases

## 2023-05-16 DIAGNOSIS — Z136 Encounter for screening for cardiovascular disorders: Secondary | ICD-10-CM | POA: Insufficient documentation

## 2023-05-16 DIAGNOSIS — E782 Mixed hyperlipidemia: Secondary | ICD-10-CM | POA: Insufficient documentation

## 2023-05-16 DIAGNOSIS — Z0000 Encounter for general adult medical examination without abnormal findings: Secondary | ICD-10-CM | POA: Insufficient documentation

## 2024-03-15 ENCOUNTER — Other Ambulatory Visit: Payer: Self-pay | Admitting: Infectious Diseases

## 2024-03-15 DIAGNOSIS — Z1231 Encounter for screening mammogram for malignant neoplasm of breast: Secondary | ICD-10-CM

## 2024-04-24 ENCOUNTER — Ambulatory Visit
Admission: RE | Admit: 2024-04-24 | Discharge: 2024-04-24 | Disposition: A | Discharge status: Home / Self Care | Source: Ambulatory Visit | Attending: Infectious Diseases | Admitting: Infectious Diseases

## 2024-04-24 DIAGNOSIS — Z1231 Encounter for screening mammogram for malignant neoplasm of breast: Secondary | ICD-10-CM | POA: Insufficient documentation
